# Patient Record
Sex: Male | Born: 2000 | Race: Black or African American | Hispanic: No | Marital: Single | Smoking: Never smoker
Health system: Southern US, Community
[De-identification: ages and names within clinical notes are randomized; demographics above are authoritative.]

## PROBLEM LIST (undated history)

## (undated) DIAGNOSIS — G35 Multiple sclerosis: Secondary | ICD-10-CM

## (undated) HISTORY — DX: Multiple sclerosis: G35

## (undated) HISTORY — PX: NO PAST SURGERIES: SHX2092

---

## 2019-07-31 ENCOUNTER — Emergency Department: Payer: Medicaid Other

## 2019-07-31 ENCOUNTER — Other Ambulatory Visit: Payer: Self-pay

## 2019-07-31 ENCOUNTER — Inpatient Hospital Stay
Admission: EM | Admit: 2019-07-31 | Discharge: 2019-08-03 | DRG: 060 | Disposition: A | Discharge status: Home / Self Care | Payer: Medicaid Other | Attending: Family Medicine | Admitting: Family Medicine

## 2019-07-31 DIAGNOSIS — R270 Ataxia, unspecified: Secondary | ICD-10-CM | POA: Diagnosis not present

## 2019-07-31 DIAGNOSIS — R260 Ataxic gait: Secondary | ICD-10-CM | POA: Diagnosis present

## 2019-07-31 DIAGNOSIS — Z820 Family history of epilepsy and other diseases of the nervous system: Secondary | ICD-10-CM

## 2019-07-31 DIAGNOSIS — R200 Anesthesia of skin: Secondary | ICD-10-CM

## 2019-07-31 DIAGNOSIS — R531 Weakness: Secondary | ICD-10-CM | POA: Diagnosis not present

## 2019-07-31 DIAGNOSIS — G35 Multiple sclerosis: Principal | ICD-10-CM | POA: Diagnosis present

## 2019-07-31 DIAGNOSIS — M545 Low back pain: Secondary | ICD-10-CM | POA: Diagnosis not present

## 2019-07-31 DIAGNOSIS — Z20822 Contact with and (suspected) exposure to covid-19: Secondary | ICD-10-CM | POA: Diagnosis present

## 2019-07-31 DIAGNOSIS — R29898 Other symptoms and signs involving the musculoskeletal system: Secondary | ICD-10-CM | POA: Diagnosis present

## 2019-07-31 DIAGNOSIS — Z03818 Encounter for observation for suspected exposure to other biological agents ruled out: Secondary | ICD-10-CM | POA: Diagnosis not present

## 2019-07-31 DIAGNOSIS — M6281 Muscle weakness (generalized): Secondary | ICD-10-CM | POA: Diagnosis not present

## 2019-07-31 LAB — CBC
HCT: 45.2 % (ref 39.0–52.0)
Hemoglobin: 14.7 g/dL (ref 13.0–17.0)
MCH: 27.8 pg (ref 26.0–34.0)
MCHC: 32.5 g/dL (ref 30.0–36.0)
MCV: 85.4 fL (ref 80.0–100.0)
Platelets: 258 10*3/uL (ref 150–400)
RBC: 5.29 MIL/uL (ref 4.22–5.81)
RDW: 12.3 % (ref 11.5–15.5)
WBC: 5.4 10*3/uL (ref 4.0–10.5)
nRBC: 0 % (ref 0.0–0.2)

## 2019-07-31 LAB — COMPREHENSIVE METABOLIC PANEL
ALT: 10 U/L (ref 0–44)
AST: 19 U/L (ref 15–41)
Albumin: 4.6 g/dL (ref 3.5–5.0)
Alkaline Phosphatase: 58 U/L (ref 38–126)
Anion gap: 10 (ref 5–15)
BUN: 11 mg/dL (ref 6–20)
CO2: 26 mmol/L (ref 22–32)
Calcium: 9.5 mg/dL (ref 8.9–10.3)
Chloride: 104 mmol/L (ref 98–111)
Creatinine, Ser: 0.87 mg/dL (ref 0.61–1.24)
GFR calc Af Amer: >60 mL/min (ref >60)
GFR calc non Af Amer: >60 mL/min (ref >60)
Glucose, Bld: 92 mg/dL (ref 70–99)
Potassium: 3.6 mmol/L (ref 3.5–5.1)
Sodium: 140 mmol/L (ref 135–145)
Total Bilirubin: 0.8 mg/dL (ref 0.3–1.2)
Total Protein: 7.9 g/dL (ref 6.5–8.1)

## 2019-07-31 LAB — GLUCOSE, CSF: Glucose, CSF: 54 mg/dL (ref 40–70)

## 2019-07-31 LAB — PROTEIN, CSF: Total  Protein, CSF: 42 mg/dL (ref 15–45)

## 2019-07-31 LAB — SEDIMENTATION RATE: Sed Rate: 5 mm/hr (ref 0–15)

## 2019-07-31 NOTE — ED Notes (Signed)
Complains of numbness of both legs starting at the waist. States this has been occurring for 4 days. States he has difficulty ambulating

## 2019-07-31 NOTE — ED Triage Notes (Signed)
Pt over by [facility]. Per [facility], the patients brain has not been talking to his legs for the past couple of days and he was having trouble walking.

## 2019-07-31 NOTE — ED Notes (Signed)
Pt states 5 days ago he was using the bathroom when he noticed his bottom was numb, pt got up stumbled and legs felt numb. Pt states he can move legs, denies pain, just feels increasingly numb.

## 2019-07-31 NOTE — ED Notes (Signed)
On phone with MRI.

## 2019-07-31 NOTE — ED Notes (Signed)
Dr. Brown at the bedside to discuss plan of care 

## 2019-07-31 NOTE — ED Triage Notes (Signed)
Pt sent from [facility] with c/o numbness of BL LE for the past 4 days states to the point he is having difficulty walking. Denies any injury. Denies any loss of bowel or bladder function

## 2019-07-31 NOTE — ED Provider Notes (Signed)
[facility] Emergency Department Provider Note  ____________________________________________   First MD Initiated Contact with Patient 07/31/19 1950     (approximate)  I have reviewed the triage vital signs and the nursing notes.   HISTORY  Chief Complaint Numbness    HPI Aaron Owens is a 19 y.o. male with no known chronic medical conditions presents to the emergency department secondary to 5-day history of perianal numbness, bilateral lower extremity weakness numbness and inability to ambulate since yesterday as a result.  Patient states that 5 days ago he noticed that when he would wipe his bottom after defecation that it was numb.  Patient states after defecating he when he attempted to walk he noticed bilateral lower extremity numbness which is progressively worsened since onset.  Patient states that starting yesterday he began having difficulty and inability to ambulate.  Patient denies any trauma.  Patient denies any back pain.  Patient denies any headache no visual changes.  No nausea or vomiting. Patient denies any dyspnea        History reviewed. No pertinent past medical history.  There are no problems to display for this patient.   History reviewed. No pertinent surgical history.  Prior to Admission medications   Not on File    Allergies Patient has no known allergies.  No family history on file.  Social History Social History   Tobacco Use  . Smoking status: Never Smoker  . Smokeless tobacco: Never Used  Substance Use Topics  . Alcohol use: Not Currently  . Drug use: Not Currently    Review of Systems Constitutional: No fever/chills Eyes: No visual changes. ENT: No sore throat. Cardiovascular: Denies chest pain. Respiratory: Denies shortness of breath. Gastrointestinal: No abdominal pain.  No nausea, no vomiting.  No diarrhea.  No constipation. Genitourinary: Negative for dysuria. Musculoskeletal: Negative for neck  pain.  Negative for back pain. Integumentary: Negative for rash. Neurological: Negative for headaches, focal weakness or numbness.   ____________________________________________   PHYSICAL EXAM:  VITAL SIGNS: ED Triage Vitals  Enc Vitals Group     BP 07/31/19 1654 123/76     Pulse Rate 07/31/19 1654 89     Resp 07/31/19 1654 17     Temp 07/31/19 1654 98.4 F (36.9 C)     Temp Source 07/31/19 1654 Oral     SpO2 07/31/19 1654 98 %     Weight 07/31/19 1655 63.5 kg (140 lb)     Height 07/31/19 1655 1.702 m (5\' 7" )     Head Circumference --      Peak Flow --      Pain Score 07/31/19 1655 0     Pain Loc --      Pain Edu? --      Excl. in GC? --     Constitutional: Alert and oriented. Eyes: Conjunctivae are normal.  Mouth/Throat: Patient is wearing a mask. Neck: No stridor.  No meningeal signs.   Cardiovascular: Normal rate, regular rhythm. Good peripheral circulation. Grossly normal heart sounds. Respiratory: Normal respiratory effort.  No retractions. Gastrointestinal: Soft and nontender. No distention.  Musculoskeletal: No lower extremity tenderness nor edema. No gross deformities of extremities. Neurologic:  Normal speech and language.  No pronator drift, no lower extremity weakness with elevation of leg off the bed for 5 seconds.  However with ambulation patient unable to bear his own weight, with assistance the patient is markedly ataxic.  Patellar reflex 2+, ankle reflex 1+ Skin:  Skin is warm, dry  and intact. Psychiatric: Mood and affect are normal. Speech and behavior are normal.  ____________________________________________   LABS (all labs ordered are listed, but only abnormal results are displayed)  Labs Reviewed  CSF CELL COUNT WITH DIFFERENTIAL - Abnormal; Notable for the following components:      Result Value   Appearance, CSF CLEAR (*)    RBC Count, CSF 105 (*)    WBC, CSF 9 (*)    Lymphs, CSF 98 (*)    Monocyte-Macrophage-Spinal Fluid 2 (*)    All  other components within normal limits  RESPIRATORY PANEL BY RT PCR (FLU A&B, COVID)  CSF CULTURE  CBC  COMPREHENSIVE METABOLIC PANEL  SEDIMENTATION RATE  GLUCOSE, CSF  PROTEIN, CSF  B. BURGDORFI ANTIBODIES  LYME DISEASE, WESTERN BLOT  C-REACTIVE PROTEIN   _________________________________  RADIOLOGY I, Sheridan N Jasmen Emrich, personally viewed and evaluated these images (plain radiographs) as part of my medical decision making, as well as reviewing the written report by the radiologist.  ED MD interpretation: CT head revealed no acute intracranial abnormality.  Normal MRI of the lumbar spine.  Official radiology report(s): CT Head Wo Contrast  Result Date: 07/31/2019 CLINICAL DATA:  Ataxia, stroke suspected. EXAM: CT HEAD WITHOUT CONTRAST TECHNIQUE: Contiguous axial images were obtained from the base of the skull through the vertex without intravenous contrast. COMPARISON:  None. FINDINGS: Brain: No evidence of acute infarction, hemorrhage, hydrocephalus, extra-axial collection or mass lesion/mass effect. Vascular: No hyperdense vessel or unexpected calcification. Skull: No calvarial fracture or suspicious osseous lesion. No scalp swelling or hematoma. Sinuses/Orbits: Paranasal sinuses and mastoid air cells are predominantly clear. Included orbital structures are unremarkable. Other: None IMPRESSION: No acute intracranial abnormality. If persisting concern for infarct or ischemia, MRI is more sensitive and specific for early features. Electronically Signed   By: Kreg Shropshire M.D.   On: 07/31/2019 20:35   MR LUMBAR SPINE WO CONTRAST  Result Date: 07/31/2019 CLINICAL DATA:  Initial evaluation for acute low back pain, progressive neurologic deficit with saddle anesthesia, bilateral leg numbness and weakness. EXAM: MRI LUMBAR SPINE WITHOUT CONTRAST TECHNIQUE: Multiplanar, multisequence MR imaging of the lumbar spine was performed. No intravenous contrast was administered. COMPARISON:  None.  FINDINGS: Segmentation: Standard. Lowest well-formed disc space labeled the L5-S1 level. Alignment: Physiologic with preservation of the normal lumbar lordosis. No listhesis. Vertebrae: Vertebral body height maintained without evidence for acute or chronic fracture. Bone marrow signal intensity within normal limits. No discrete or worrisome osseous lesions. No abnormal marrow edema. Conus medullaris and cauda equina: Conus extends to the T12 level. Conus and cauda equina appear normal. Paraspinal and other soft tissues: Paraspinous soft tissues within normal limits. 11 mm simple cyst noted within the interpolar left kidney. Visualized visceral structures otherwise unremarkable. Disc levels: No significant disc pathology seen within the lumbar spine. Intervertebral discs are well hydrated with preserved disc height. No disc bulge or focal disc protrusion. No significant facet degeneration. No canal or neural foraminal stenosis. No neural impingement. IMPRESSION: Normal MRI of the lumbar spine. No findings to explain patient's symptoms identified. Electronically Signed   By: Rise Mu M.D.   On: 07/31/2019 21:05     .Lumbar Puncture  Date/Time: 08/01/2019 12:23 AM Performed by: Darci Current, MD Authorized by: Darci Current, MD   Consent:    Consent obtained:  Verbal and written   Consent given by:  Patient   Risks discussed:  Headache, bleeding, infection, pain and nerve damage Pre-procedure details:    Procedure purpose:  Diagnostic   Preparation: Patient was prepped and draped in usual sterile fashion   Anesthesia (see MAR for exact dosages):    Anesthesia method:  Local infiltration   Local anesthetic:  Lidocaine 1% w/o epi Procedure details:    Lumbar space:  L3-L4 interspace   Patient position:  L lateral decubitus   Needle gauge:  22   Needle type:  Spinal needle - Quincke tip   Needle length (in):  2.5   Number of attempts:  2   Fluid appearance:  Blood-tinged and  clear   Tubes of fluid:  4 Post-procedure:    Puncture site:  Adhesive bandage applied and direct pressure applied   Patient tolerance of procedure:  Tolerated well, no immediate complications  .Critical Care Performed by: Darci Current, MD Authorized by: Darci Current, MD   Critical care provider statement:    Critical care time (minutes):  60   Critical care time was exclusive of:  Separately billable procedures and treating other patients   Critical care was necessary to treat or prevent imminent or life-threatening deterioration of the following conditions:  CNS failure or compromise   Critical care was time spent personally by me on the following activities:  Development of treatment plan with patient or surrogate, discussions with consultants, evaluation of patient's response to treatment, examination of patient, obtaining history from patient or surrogate, ordering and performing treatments and interventions, ordering and review of laboratory studies, ordering and review of radiographic studies, pulse oximetry, re-evaluation of patient's condition and review of old charts     ____________________________________________   INITIAL IMPRESSION / MDM / ASSESSMENT AND PLAN / ED COURSE  As part of my medical decision making, I reviewed the following data within the electronic MEDICAL RECORD NUMBER  19 year old male presenting with above-stated history and physical exam with differential diagnosis including but not limited to Almond Lint, lumbar spine lesion with spinal cord compression, syrinx, transverse myelitis, multiple sclerosis.  As such MRI of the lumbar spine was performed which revealed no gross abnormality CT scan of the head also performed which was unremarkable.  Lumbar puncture performed and pending.   23:50 Patient discussed with Dr Algis Greenhouse Neurology on call at [facility] who agreed with concern for Margarita Mail however unfortunately [facility] has no available beds  12:00 AM  patient discussed with [facility] transfer center who advised that they had no capacity.   12:05 AM patient discussed at [facility], awaiting call back from hospitalist   12:38 AM patient discussed with Dr. Onalee Hua hospitalist on-call at [facility].  I was informed that there was no capacity at [facility] at this time and as such the patient will be waitlisted  12:42 AM  Patient discussed with Dr. Jerrell Belfast neurologist on-call at [facility] who states that "this is not Aggie Hacker".  He advised that NO IVIG should be administered.  Dr. Lisabeth Devoid recommended that the patient be admitted to the hospital here with neurology consultation tomorrow morning.  Patient subsequently discussed with Dr. Maisie Fus hospitalist here at [facility] for admission for further evaluation and management.  Clinical Course as of Jul 31 21  Tue Jul 31, 2019  2143 Lyme disease dna by pcr(borrelia burg) [RB]    Clinical Course User Index [RB] Darci Current, MD     ____________________________________________  FINAL CLINICAL IMPRESSION(S) / ED DIAGNOSES  Final diagnoses:  Ataxia  Saddle anesthesia     MEDICATIONS GIVEN DURING THIS VISIT:  Medications - No  data to display   ED Discharge Orders    None      *Please note:  Stanlee Coll was evaluated in Emergency Department on 08/01/2019 for the symptoms described in the history of present illness. He was evaluated in the context of the global COVID-19 pandemic, which necessitated consideration that the patient might be at risk for infection with the SARS-CoV-2 virus that causes COVID-19. Institutional protocols and algorithms that pertain to the evaluation of patients at risk for COVID-19 are in a state of rapid change based on information released by regulatory bodies including the CDC and federal and state organizations. These policies and algorithms were followed during the patient's care in the ED.  Some ED evaluations and  interventions may be delayed as a result of limited staffing during the pandemic.*  Note:  This document was prepared using Dragon voice recognition software and may include unintentional dictation errors.   Gregor Hams, MD 08/01/19 0100

## 2019-07-31 NOTE — ED Notes (Signed)
Pt to CT

## 2019-08-01 ENCOUNTER — Inpatient Hospital Stay
Admission: EM | Admit: 2019-08-01 | Payer: Medicaid Other | Source: Other Acute Inpatient Hospital | Admitting: Family Medicine

## 2019-08-01 DIAGNOSIS — Z820 Family history of epilepsy and other diseases of the nervous system: Secondary | ICD-10-CM | POA: Diagnosis not present

## 2019-08-01 DIAGNOSIS — R29898 Other symptoms and signs involving the musculoskeletal system: Secondary | ICD-10-CM | POA: Diagnosis present

## 2019-08-01 DIAGNOSIS — M545 Low back pain: Secondary | ICD-10-CM | POA: Diagnosis not present

## 2019-08-01 DIAGNOSIS — R260 Ataxic gait: Secondary | ICD-10-CM | POA: Diagnosis not present

## 2019-08-01 DIAGNOSIS — G35 Multiple sclerosis: Principal | ICD-10-CM

## 2019-08-01 DIAGNOSIS — M6281 Muscle weakness (generalized): Secondary | ICD-10-CM | POA: Diagnosis not present

## 2019-08-01 DIAGNOSIS — R270 Ataxia, unspecified: Secondary | ICD-10-CM | POA: Diagnosis not present

## 2019-08-01 DIAGNOSIS — R531 Weakness: Secondary | ICD-10-CM | POA: Diagnosis not present

## 2019-08-01 DIAGNOSIS — Z03818 Encounter for observation for suspected exposure to other biological agents ruled out: Secondary | ICD-10-CM | POA: Diagnosis not present

## 2019-08-01 DIAGNOSIS — Z20822 Contact with and (suspected) exposure to covid-19: Secondary | ICD-10-CM | POA: Diagnosis not present

## 2019-08-01 LAB — CSF CELL COUNT WITH DIFFERENTIAL
Eosinophils, CSF: 0 % (ref 0–1)
Lymphs, CSF: 98 % — ABNORMAL HIGH (ref 40–80)
Monocyte-Macrophage-Spinal Fluid: 2 % — ABNORMAL LOW (ref 15–45)
RBC Count, CSF: 105 /mm3 — ABNORMAL HIGH
Segmented Neutrophils-CSF: 0 % (ref 0–6)
Tube #: 1
WBC, CSF: 9 /mm3 — ABNORMAL HIGH (ref 0–5)

## 2019-08-01 LAB — URINALYSIS, ROUTINE W REFLEX MICROSCOPIC
Bacteria, UA: NONE SEEN
Bilirubin Urine: NEGATIVE
Glucose, UA: NEGATIVE mg/dL
Hgb urine dipstick: NEGATIVE
Ketones, ur: 20 mg/dL — AB
Leukocytes,Ua: NEGATIVE
Nitrite: NEGATIVE
Protein, ur: 30 mg/dL — AB
Specific Gravity, Urine: 1.036 — ABNORMAL HIGH (ref 1.005–1.030)
Squamous Epithelial / HPF: NONE SEEN (ref 0–5)
pH: 5 (ref 5.0–8.0)

## 2019-08-01 LAB — COMPREHENSIVE METABOLIC PANEL
ALT: 10 U/L (ref 0–44)
AST: 19 U/L (ref 15–41)
Albumin: 4.5 g/dL (ref 3.5–5.0)
Alkaline Phosphatase: 57 U/L (ref 38–126)
Anion gap: 9 (ref 5–15)
BUN: 13 mg/dL (ref 6–20)
CO2: 29 mmol/L (ref 22–32)
Calcium: 9.7 mg/dL (ref 8.9–10.3)
Chloride: 104 mmol/L (ref 98–111)
Creatinine, Ser: 0.89 mg/dL (ref 0.61–1.24)
GFR calc Af Amer: >60 mL/min (ref >60)
GFR calc non Af Amer: >60 mL/min (ref >60)
Glucose, Bld: 92 mg/dL (ref 70–99)
Potassium: 3.4 mmol/L — ABNORMAL LOW (ref 3.5–5.1)
Sodium: 142 mmol/L (ref 135–145)
Total Bilirubin: 1 mg/dL (ref 0.3–1.2)
Total Protein: 7.9 g/dL (ref 6.5–8.1)

## 2019-08-01 LAB — VITAMIN B12: Vitamin B-12: 286 pg/mL (ref 180–914)

## 2019-08-01 LAB — ETHANOL: Alcohol, Ethyl (B): <10 mg/dL (ref <10)

## 2019-08-01 LAB — URINE DRUG SCREEN, QUALITATIVE (ARMC ONLY)
Amphetamines, Ur Screen: NOT DETECTED
Barbiturates, Ur Screen: NOT DETECTED
Benzodiazepine, Ur Scrn: NOT DETECTED
Cannabinoid 50 Ng, Ur ~~LOC~~: POSITIVE — AB
Cocaine Metabolite,Ur ~~LOC~~: NOT DETECTED
MDMA (Ecstasy)Ur Screen: NOT DETECTED
Methadone Scn, Ur: NOT DETECTED
Opiate, Ur Screen: NOT DETECTED
Phencyclidine (PCP) Ur S: NOT DETECTED
Tricyclic, Ur Screen: NOT DETECTED

## 2019-08-01 LAB — CBC
HCT: 42.8 % (ref 39.0–52.0)
Hemoglobin: 13.8 g/dL (ref 13.0–17.0)
MCH: 27.2 pg (ref 26.0–34.0)
MCHC: 32.2 g/dL (ref 30.0–36.0)
MCV: 84.4 fL (ref 80.0–100.0)
Platelets: 251 10*3/uL (ref 150–400)
RBC: 5.07 MIL/uL (ref 4.22–5.81)
RDW: 12.4 % (ref 11.5–15.5)
WBC: 6.1 10*3/uL (ref 4.0–10.5)
nRBC: 0 % (ref 0.0–0.2)

## 2019-08-01 LAB — C-REACTIVE PROTEIN: CRP: <0.5 mg/dL (ref <1.0)

## 2019-08-01 LAB — HEMOGLOBIN A1C
Hgb A1c MFr Bld: 5.7 % — ABNORMAL HIGH (ref 4.8–5.6)
Mean Plasma Glucose: 116.89 mg/dL

## 2019-08-01 LAB — RAPID HIV SCREEN (HIV 1/2 AB+AG)
HIV 1/2 Antibodies: NONREACTIVE
HIV-1 P24 Antigen - HIV24: NONREACTIVE

## 2019-08-01 LAB — RESPIRATORY PANEL BY RT PCR (FLU A&B, COVID)
Influenza A by PCR: NEGATIVE
Influenza B by PCR: NEGATIVE
SARS Coronavirus 2 by RT PCR: NEGATIVE

## 2019-08-01 LAB — CK: Total CK: 131 U/L (ref 49–397)

## 2019-08-01 LAB — FOLATE: Folate: 7.8 ng/mL (ref >5.9)

## 2019-08-01 LAB — TSH: TSH: 1.992 u[IU]/mL (ref 0.350–4.500)

## 2019-08-01 MED ORDER — THIAMINE HCL 100 MG/ML IJ SOLN: Freq: Once | INTRAVENOUS | Status: DC

## 2019-08-01 MED ORDER — ONDANSETRON HCL 4 MG/2ML IJ SOLN: 4.0000 mg | Freq: Four times a day (QID) | INTRAMUSCULAR | Status: DC | PRN

## 2019-08-01 MED ORDER — ACETAMINOPHEN 650 MG RE SUPP: 650.0000 mg | Freq: Four times a day (QID) | RECTAL | Status: DC | PRN

## 2019-08-01 MED ORDER — SODIUM CHLORIDE 0.9 % IV SOLN
1000.0000 mg | INTRAVENOUS | Status: AC
  Administered 2019-08-01 – 2019-08-03 (×3): 1000 mg via INTRAVENOUS
  Filled 2019-08-01 (×3): qty 8

## 2019-08-01 MED ORDER — ACETAMINOPHEN 325 MG PO TABS: 650.0000 mg | ORAL_TABLET | Freq: Four times a day (QID) | ORAL | Status: DC | PRN

## 2019-08-01 MED ORDER — GADOBUTROL 1 MMOL/ML IV SOLN
6.0000 mL | Freq: Once | INTRAVENOUS | Status: AC | PRN
  Administered 2019-08-01: 6 mL via INTRAVENOUS

## 2019-08-01 MED ORDER — PANTOPRAZOLE SODIUM 40 MG PO TBEC
40.0000 mg | DELAYED_RELEASE_TABLET | Freq: Every day | ORAL | Status: DC
  Administered 2019-08-01 – 2019-08-03 (×2): 40 mg via ORAL
  Filled 2019-08-01 (×3): qty 1

## 2019-08-01 MED ORDER — SODIUM CHLORIDE 0.9% FLUSH
10.0000 mL | Freq: Two times a day (BID) | INTRAVENOUS | Status: DC
  Administered 2019-08-01 – 2019-08-03 (×4): 10 mL via INTRAVENOUS

## 2019-08-01 MED ORDER — ONDANSETRON HCL 4 MG PO TABS: 4.0000 mg | ORAL_TABLET | Freq: Four times a day (QID) | ORAL | Status: DC | PRN

## 2019-08-01 MED ORDER — HEPARIN SODIUM (PORCINE) 5000 UNIT/ML IJ SOLN
5000.0000 [IU] | Freq: Three times a day (TID) | INTRAMUSCULAR | Status: DC
  Administered 2019-08-01 – 2019-08-02 (×5): 5000 [IU] via SUBCUTANEOUS
  Filled 2019-08-01 (×5): qty 1

## 2019-08-01 NOTE — ED Notes (Signed)
Pt remains in MRI at this time  

## 2019-08-01 NOTE — H&P (Addendum)
History and Physical    Aaron Owens YKD:[id number] DOB: 08/15/2000 DOA: 07/31/2019  PCP: Patient, No Pcp Per  Patient coming from: Home  I have personally briefly reviewed patient's old medical records in [facility]  Chief Complaint: Bilateral numbness of feet with inability to walk HPI: Aaron Owens is a 19 y.o. male with no past medical history, he does have family history of grandmother with multiple sclerosis who now presents to the ED with 5-day history of bilateral feet numbness with progressive ascension numbness to around umbilicus.  Patient states that around 5 days ago he tripped and injured his his right ankle he thought nothing of it at that time although he did note it at that time was somewhat numb.  As by the second day however he noted that both his feet were numb, either third day noted that numbness was extending up both his lower extremities.  He states on the fifth day he got concerned numbness continue to extend up his torso to his umbilicus.  He describes a feeling of numbness of as being in a block of ice he states he has no weakness is unable to wall walk because he is unable to feel the ground below him.  He denies any recent infections any recent illnesses, fever chills or otherwise been unwell.  Denies any sick contacts.  He notes he has never had symptoms like this in the past.   ED Course:  In the ED patient was evaluated he was noted to have negative respiratory panel, was also noted to have negative CT of the head his labs were also noted to be unremarkable.  There was concern that patient may have GBS and a lumbar spinal tap was completed which was noted normal protein 9 white cells and 105 RBCs and normal glucose.  Neurology was consulted who recommended admission and based on the clinical case did not feel strongly that patient had Guillain-Barr syndrome.  And at that time neurology on-call Dr. Wilford Corner neuro did not recommend IVIG infusion.  However further  imaging was recommended to evaluate for demyelinating lesions versus transverse myelitis.  Of note MRI results noted Extensive patchy T2/FLAIR signal abnormality involving the supratentorial and infratentorial cerebral white matter, consistent with demyelinating disease/multiple sclerosis Review of Systems: As per HPI otherwise 10 point review of systems negative.   History reviewed. No pertinent past medical history. As noted above History reviewed. No pertinent surgical history. No surgical history  reports that he has never smoked. He has never used smokeless tobacco. He reports previous alcohol use. He reports previous drug use.  No Known Allergies  No family history on file. Irving Burton history of multiple sclerosis grandmother Prior to Admission medications   Not on File    Physical Exam: Vitals:   07/31/19 1654 07/31/19 1655 08/01/19 0241  BP: 123/76  110/69  Pulse: 89  67  Resp: 17  (!) 25  Temp: 98.4 F (36.9 C)    TempSrc: Oral    SpO2: 98%  100%  Weight:  63.5 kg   Height:  5\' 7"  (1.702 m)     Constitutional: NAD, calm, comfortable Vitals:   07/31/19 1654 07/31/19 1655 08/01/19 0241  BP: 123/76  110/69  Pulse: 89  67  Resp: 17  (!) 25  Temp: 98.4 F (36.9 C)    TempSrc: Oral    SpO2: 98%  100%  Weight:  63.5 kg   Height:  5\' 7"  (1.702 m)    Eyes: PERRL, lids  and conjunctivae normal ENMT: Mucous membranes are moist. Posterior pharynx clear of any exudate or lesions.Normal dentition.  Neck: normal, supple, no masses, no thyromegaly Respiratory: clear to auscultation bilaterally, no wheezing, no crackles. Normal respiratory effort. No accessory muscle use.  Cardiovascular: Regular rate and rhythm, no murmurs / rubs / gallops. No extremity edema. 2+ pedal pulses. No carotid bruits.  Abdomen: no tenderness, no masses palpated. No hepatosplenomegaly. Bowel sounds positive.  Musculoskeletal: no clubbing / cyanosis. No joint deformity upper and lower extremities. Good  ROM, no contractures. Normal muscle tone.  Skin: no rashes, lesions, ulcers. No induration Neurologic: CN 2-12 grossly intact. Sensation intact, DTR normal. Strength 5/5 in all 4.  Patient is am to walk  Has unsteady gain. He is unable to feel his position in space . Psychiatric: Normal judgment and insight. Alert and oriented x 3. Normal mood.    Labs on Admission: I have personally reviewed following labs and imaging studies  CBC: Recent Labs  Lab 07/31/19 2007  WBC 5.4  HGB 14.7  HCT 45.2  MCV 85.4  PLT 258   Basic Metabolic Panel: Recent Labs  Lab 07/31/19 2007  NA 140  K 3.6  CL 104  CO2 26  GLUCOSE 92  BUN 11  CREATININE 0.87  CALCIUM 9.5   GFR: Estimated Creatinine Clearance: 121.6 mL/min (by C-G formula based on SCr of 0.87 mg/dL). Liver Function Tests: Recent Labs  Lab 07/31/19 2007  AST 19  ALT 10  ALKPHOS 58  BILITOT 0.8  PROT 7.9  ALBUMIN 4.6   No results for input(s): LIPASE, AMYLASE in the last 168 hours. No results for input(s): AMMONIA in the last 168 hours. Coagulation Profile: No results for input(s): INR, PROTIME in the last 168 hours. Cardiac Enzymes: No results for input(s): CKTOTAL, CKMB, CKMBINDEX, TROPONINI in the last 168 hours. BNP (last 3 results) No results for input(s): PROBNP in the last 8760 hours. HbA1C: No results for input(s): HGBA1C in the last 72 hours. CBG: No results for input(s): GLUCAP in the last 168 hours. Lipid Profile: No results for input(s): CHOL, HDL, LDLCALC, TRIG, CHOLHDL, LDLDIRECT in the last 72 hours. Thyroid Function Tests: No results for input(s): TSH, T4TOTAL, FREET4, T3FREE, THYROIDAB in the last 72 hours. Anemia Panel: No results for input(s): VITAMINB12, FOLATE, FERRITIN, TIBC, IRON, RETICCTPCT in the last 72 hours. Urine analysis: No results found for: COLORURINE, APPEARANCEUR, LABSPEC, PHURINE, GLUCOSEU, HGBUR, BILIRUBINUR, KETONESUR, PROTEINUR, UROBILINOGEN, NITRITE,  LEUKOCYTESUR  Radiological Exams on Admission: CT Head Wo Contrast  Result Date: 07/31/2019 CLINICAL DATA:  Ataxia, stroke suspected. EXAM: CT HEAD WITHOUT CONTRAST TECHNIQUE: Contiguous axial images were obtained from the base of the skull through the vertex without intravenous contrast. COMPARISON:  None. FINDINGS: Brain: No evidence of acute infarction, hemorrhage, hydrocephalus, extra-axial collection or mass lesion/mass effect. Vascular: No hyperdense vessel or unexpected calcification. Skull: No calvarial fracture or suspicious osseous lesion. No scalp swelling or hematoma. Sinuses/Orbits: Paranasal sinuses and mastoid air cells are predominantly clear. Included orbital structures are unremarkable. Other: None IMPRESSION: No acute intracranial abnormality. If persisting concern for infarct or ischemia, MRI is more sensitive and specific for early features. Electronically Signed   By: Kreg Shropshire M.D.   On: 07/31/2019 20:35   MR LUMBAR SPINE WO CONTRAST  Result Date: 07/31/2019 CLINICAL DATA:  Initial evaluation for acute low back pain, progressive neurologic deficit with saddle anesthesia, bilateral leg numbness and weakness. EXAM: MRI LUMBAR SPINE WITHOUT CONTRAST TECHNIQUE: Multiplanar, multisequence MR imaging of the  lumbar spine was performed. No intravenous contrast was administered. COMPARISON:  None. FINDINGS: Segmentation: Standard. Lowest well-formed disc space labeled the L5-S1 level. Alignment: Physiologic with preservation of the normal lumbar lordosis. No listhesis. Vertebrae: Vertebral body height maintained without evidence for acute or chronic fracture. Bone marrow signal intensity within normal limits. No discrete or worrisome osseous lesions. No abnormal marrow edema. Conus medullaris and cauda equina: Conus extends to the T12 level. Conus and cauda equina appear normal. Paraspinal and other soft tissues: Paraspinous soft tissues within normal limits. 11 mm simple cyst noted within  the interpolar left kidney. Visualized visceral structures otherwise unremarkable. Disc levels: No significant disc pathology seen within the lumbar spine. Intervertebral discs are well hydrated with preserved disc height. No disc bulge or focal disc protrusion. No significant facet degeneration. No canal or neural foraminal stenosis. No neural impingement. IMPRESSION: Normal MRI of the lumbar spine. No findings to explain patient's symptoms identified. Electronically Signed   By: Jeannine Boga M.D.   On: 07/31/2019 21:05    EKG: Independently reviewed. N/A  Assessment/Plan Multiple sclerosis new diagnosis   Plan New diagnosis of multiple sclerosis -We will start on pulse dose steroids 1g solumedrol x 3 days  -Neurology consult in a.m. for further Rex  GI prophylaxis -PPI  DVT prophylaxis: Lovenox  code Status: Full  family Communication: Not applicable Disposition Plan: 5 days  consults called: Neurology Dr. Rory Percy Admission status: Inpatient  Clance Boll MD [facility] Pager [phone]  If 7PM-7AM, please contact night-coverage [email or web address] Password [facility]  08/01/2019, 2:46 AM

## 2019-08-01 NOTE — ED Notes (Signed)
Went in to check on pt and pt is resting at this time.

## 2019-08-01 NOTE — ED Notes (Signed)
Pt resting in bed, nad at this time

## 2019-08-01 NOTE — ED Notes (Signed)
Pt given meal tray and apple juice 

## 2019-08-01 NOTE — Consult Note (Signed)
Neurology Consultation Reason for Consult: Multiple sclerosis Referring Physician: Skip Mayer  CC: Difficulty walking  History is obtained from: Patient  HPI: Aaron Owens is a 19 y.o. male who presents with numbness and difficulty walking that is been going on for the past few days.  He states that he has to look down and watch where his feet are in order to be able to walk.  He reports a similar episode in 2018 where he had left leg numbness that gradually improved over the course of several weeks.  He also reports an episode of vision change in early 2020 where he had double vision but also improved over several weeks.    ROS: A 14 point ROS was performed and is negative except as noted in the HPI.   History reviewed. No pertinent past medical history.   Family history: Great-grandmother with MS  Social History:  reports that he has never smoked. He has never used smokeless tobacco. He reports previous alcohol use. He reports previous drug use.   Exam: Current vital signs: BP 103/64   Pulse 69   Temp 98.4 F (36.9 C) (Oral)   Resp 17   Ht 5\' 7"  (1.702 m)   Wt 63.5 kg   SpO2 98%   BMI 21.93 kg/m  Vital signs in last 24 hours: Temp:  [98.4 F (36.9 C)] 98.4 F (36.9 C) (01/12 1654) Pulse Rate:  [53-89] 69 (01/13 0600) Resp:  [16-25] 17 (01/13 0630) BP: (99-123)/(57-78) 103/64 (01/13 0630) SpO2:  [97 %-100 %] 98 % (01/13 0600) Weight:  [63.5 kg] 63.5 kg (01/12 1655)   Physical Exam  Constitutional: Appears well-developed and well-nourished.  Psych: Affect appropriate to situation Eyes: No scleral injection HENT: No OP obstrucion MSK: no joint deformities.  Cardiovascular: Normal rate and regular rhythm.  Respiratory: Effort normal, non-labored breathing GI: Soft.  No distension. There is no tenderness.  Skin: WDI  Neuro: Mental Status: Patient is awake, alert, oriented to person, place, month, year, and situation. Patient is able to give a clear  and coherent history. No signs of aphasia or neglect Cranial Nerves: II: Visual Fields are full. Pupils are equal, round, and reactive to light.   III,IV, VI: EOMI without ptosis or diploplia.  V: Facial sensation is symmetric to temperature VII: Facial movement is symmetric.  VIII: hearing is intact to voice X: Uvula elevates symmetrically XI: Shoulder shrug is symmetric. XII: tongue is midline without atrophy or fasciculations.  Motor: Tone is normal. Bulk is normal. 5/5 strength was present in all four extremities with the exception of hip flexion on the right which is 4+/5 Sensory: Sensation is diminished distally in both lower extremities Deep Tendon Reflexes: 2+ and symmetric in the biceps and patellae.  Plantars: Toes are downgoing bilaterally.  Cerebellar: FNF intact bilaterally   I have reviewed labs in epic and the results pertinent to this consultation are: CMP-unremarkable  I have reviewed the images obtained: MRI brain-spine-multiple lesions consistent with multiple sclerosis  Impression: 19 year old male with at least 2 previous clinical episodes and enhancing/nonenhancing lesions in multiple locations including the spinal cord in a pattern very much consistent with multiple sclerosis.  The mild pleocytosis on LP is consistent with that as well.  He has been started on high-dose IV Solu-Medrol  Recommendations: 1) continue Solu-Medrol for 3 to 5 days 2) add OC bands and IgG index to CSF 3) he will need follow-up with neurology for disease modifying therapy.    24, MD [facility] [phone]  If 7pm- 7am, please page neurology on call as listed in [place].

## 2019-08-01 NOTE — Plan of Care (Addendum)
Called by Dr. Manson Passey regarding concern for Guillain-Barr syndrome for this patient. Based on history-5 days worth of ascending tingling, numbness and weakness. Based on Dr. Theora Gianotti examination, he has lower extremity weakness and ataxia but has preserved knee jerks and mildly reduced ankle jerks bilaterally. CSF studies show normal protein and nine white cells with 105 RBCs and normal glucose. I do not think that this is consistent with AIDP. I would recommend further imaging-MRI of the brain C-spine and T-spine with and without contrast to look for demyelinating lesions versus transverse myelitis as the etiology of this presentation.  Lumbar spine MRI was obtained for emergent concern of cauda equina syndrome and is normal at this time.  I have recommended that the patient be admitted if he is not able to [facility], imaging be done and a inpatient neurological consultation be obtained over [facility] in the morning.  Please call [facility] as needed.  -- Milon Dikes, MD [facility] Pager: [phone] If 7pm to 7am, please call on call as listed on AMION.  ADDENDUM -MRI brain and C-spine with demyelinating lesions, with active enhancement. Likely MS. -Recommend IV solumedrol - 1g daily for 3 days. Please call on call neurologist as listed on AMION in the AM. Further recs after consult in the AM. Please call [facility] as needed.  -- Milon Dikes, MD [facility] Pager: [phone] If 7pm to 7am, please call on call as listed on AMION.

## 2019-08-02 LAB — IGG CSF INDEX
Albumin CSF-mCnc: 32 mg/dL (ref 11–48)
Albumin: 4.7 g/dL (ref 4.1–5.2)
CSF IgG Index: 0.8 — ABNORMAL HIGH (ref 0.0–0.7)
IgG (Immunoglobin G), Serum: 1439 mg/dL (ref 603–1613)
IgG, CSF: 7.6 mg/dL (ref 0.0–8.6)
IgG/Alb Ratio, CSF: 0.24 (ref 0.00–0.25)

## 2019-08-02 LAB — B. BURGDORFI ANTIBODIES: B burgdorferi Ab IgG+IgM: <0.91 {ISR} (ref 0.00–0.90)

## 2019-08-02 MED ORDER — ENOXAPARIN SODIUM 40 MG/0.4ML ~~LOC~~ SOLN
40.0000 mg | SUBCUTANEOUS | Status: DC
  Administered 2019-08-03: 40 mg via SUBCUTANEOUS
  Filled 2019-08-02: qty 0.4

## 2019-08-02 MED ORDER — SODIUM CHLORIDE 0.9 % IV SOLN
INTRAVENOUS | Status: DC | PRN
  Administered 2019-08-02: 250 mL via INTRAVENOUS

## 2019-08-02 NOTE — Progress Notes (Signed)
Reason for consult: MS exacerbation  Subjective: patient feels better, has been working with PT/OT   ROS: negative except above  Examination  Vital signs in last 24 hours: Temp:  [97.6 F (36.4 C)-98.4 F (36.9 C)] 97.8 F (36.6 C) (01/14 0801) Pulse Rate:  [56-95] 56 (01/14 0801) Resp:  [13-20] 18 (01/14 0801) BP: (105-131)/(65-80) 105/71 (01/14 0801) SpO2:  [99 %-100 %] 100 % (01/14 0801) Weight:  [59.4 kg-61.9 kg] 61.9 kg (01/14 0452)  General: lying in bed CVS: pulse-normal rate and rhythm RS: breathing comfortably Extremities: normal   Neuro: MS: Alert, oriented, follows commands CN: pupils equal and reactive,  EOMI, face symmetric, tongue midline, normal sensation over face, Motor: right leg 4+/5, remaining extremities 5/5 Reflexes: 2+ bilaterally over patella, biceps, plantars: flexor Coordination: normal Gait: not tested  Basic Metabolic Panel: Recent Labs  Lab 07/31/19 2007 08/01/19 0345  NA 140 142  K 3.6 3.4*  CL 104 104  CO2 26 29  GLUCOSE 92 92  BUN 11 13  CREATININE 0.87 0.89  CALCIUM 9.5 9.7    CBC: Recent Labs  Lab 07/31/19 2007 08/01/19 0345  WBC 5.4 6.1  HGB 14.7 13.8  HCT 45.2 42.8  MCV 85.4 84.4  PLT 258 251     Coagulation Studies: No results for input(s): LABPROT, INR in the last 72 hours.  Imaging Reviewed:  MRI Brain, C and T spine   ASSESSMENT AND PLAN  32 y male with numbness and difficulty walking with new diagnosis with MS.Has 2 previous clinical episodes of numbness,  MRI Brain, C and T spine consistent with MS with some enchancing lesions suggestive acute demyelination.  Plan 1) continue Solu-Medrol for 3 to 5 days 2)  IgG index /Oligoclonal bands pending 3) Will need follow up : recommend Dr. Epimenio Foot with [facility] as soon as possible to start DMD 4) Continue PT/OT   Spent 25 min counseling on diagnosis of MS and importance of starting DMD to prevent MS progression.       Georgiana Spinner Mercer Stallworth [facility] Pager Number [phone] For questions after 7pm please refer to AMION to reach the Neurologist on call

## 2019-08-02 NOTE — Progress Notes (Signed)
PROGRESS NOTE    Maico Cardell  BJY:[id number] DOB: 2001/07/16 DOA: 07/31/2019 PCP: Patient, No Pcp Per      Brief Narrative:  Mr. Forcier is a 19 y.o. M with no significant past medical history who presented with several days bilateral feet numbness, saddle anesthesia, and difficulty walking.  Of note, the patient had a similar episode in 2018, where he had leg numbness "like if you been sitting on the toilet too long" the gradually improved by itself over several weeks, as well as an episode of diplopia in early 2020 that also improved by itself after several weeks.  In the ER here, the patient had an LP that showed mild pleocytosis, and an MRI that showed extensive patchy T2/flair signal abnormality in the supratentorial and infratentorial white matter.  Case was discussed with neurology recommended admission for high-dose Solu-Medrol.        Assessment & Plan:  New diagnosis multiple sclerosis -Consult neurology, appreciate cares -Continue Solu-Medrol 1g daily, day 2 -PT evaluation        Disposition: The patient was admitted with new onset multiple sclerosis.   I will discharge when neurology have determined that he has completed his treatment, he has had a PT evaluation.        MDM: The below labs and imaging reports were reviewed and summarized above.  Medication management as above.   DVT prophylaxis: Lovenox Code Status: Full code Family Communication: Friend at the bedside    Consultants:   Neurology  Procedures:     Antimicrobials:       Subjective: Patient is feeling well, his numbness seems to be receding, he is able to sit on the side of the bed, feels better.  No new vision changes, headache, fever, ataxia.  Objective: Vitals:   08/01/19 1831 08/01/19 1925 08/02/19 0452 08/02/19 0801  BP: 116/65 115/67 110/80 105/71  Pulse: 75 78 66 (!) 56  Resp: Temp: 98.4 F (36.9 C) 98 F (36.7 C) 97.6 F (36.4 C) 97.8 F  (36.6 C)  TempSrc: Oral Oral Oral Oral  SpO2: 99% 99% 100% 100%  Weight:   61.9 kg   Height:        Intake/Output Summary (Last 24 hours) at 08/02/2019 1836 Last data filed at 08/02/2019 1316 Gross per 24 hour  Intake 78.81 ml  Output 470 ml  Net -391.19 ml   Filed Weights   07/31/19 1655 08/01/19 1643 08/02/19 0452  Weight: 63.5 kg 59.4 kg 61.9 kg    Examination: General appearance: Well-nourished adult male, alert and in no acute distress.  Sitting up in bed HEENT: Anicteric, conjunctiva pink, lids and lashes normal. No nasal deformity, discharge, epistaxis.  Lips moist.   Skin: Warm and dry.  No jaundice.  No suspicious rashes or lesions. Cardiac: RRR, nl S1-S2, no murmurs appreciated.  Capillary refill is brisk.  JVP normal.  No LE edema.  Radial pulses 2+ and symmetric. Respiratory: Normal respiratory rate and rhythm.  CTAB without rales or wheezes. Abdomen: Abdomen soft.  No TTP or guarding. No ascites, distension, hepatosplenomegaly.   MSK: No deformities or effusions. Neuro: Awake and alert.  EOMI, moves all extremities. Speech fluent.    Psych: Sensorium intact and responding to questions, attention normal. Affect normal.  Judgment and insight appear normal.    Data Reviewed: I have personally reviewed following labs and imaging studies:  CBC: Recent Labs  Lab 07/31/19 2007 08/01/19 0345  WBC 5.4 6.1  HGB 14.7  13.8  HCT 45.2 42.8  MCV 85.4 84.4  PLT 258 251   Basic Metabolic Panel: Recent Labs  Lab 07/31/19 2007 08/01/19 0345  NA 140 142  K 3.6 3.4*  CL 104 104  CO2 26 29  GLUCOSE 92 92  BUN 11 13  CREATININE 0.87 0.89  CALCIUM 9.5 9.7   GFR: Estimated Creatinine Clearance: 115.9 mL/min (by C-G formula based on SCr of 0.89 mg/dL). Liver Function Tests: Recent Labs  Lab 07/31/19 2007 07/31/19 2242 08/01/19 0345  AST 19  --  19  ALT 10  --  10  ALKPHOS 58  --  57  BILITOT 0.8  --  1.0  PROT 7.9  --  7.9  ALBUMIN 4.6 4.7 4.5   No results  for input(s): LIPASE, AMYLASE in the last 168 hours. No results for input(s): AMMONIA in the last 168 hours. Coagulation Profile: No results for input(s): INR, PROTIME in the last 168 hours. Cardiac Enzymes: Recent Labs  Lab 08/01/19 1710  CKTOTAL 131   BNP (last 3 results) No results for input(s): PROBNP in the last 8760 hours. HbA1C: Recent Labs    08/01/19 0345  HGBA1C 5.7*   CBG: No results for input(s): GLUCAP in the last 168 hours. Lipid Profile: No results for input(s): CHOL, HDL, LDLCALC, TRIG, CHOLHDL, LDLDIRECT in the last 72 hours. Thyroid Function Tests: Recent Labs    08/01/19 0345  TSH 1.992   Anemia Panel: Recent Labs    08/01/19 0345  VITAMINB12 286  FOLATE 7.8   Urine analysis:    Component Value Date/Time   COLORURINE YELLOW (A) 08/01/2019 1224   APPEARANCEUR CLEAR (A) 08/01/2019 1224   LABSPEC 1.036 (H) 08/01/2019 1224   PHURINE 5.0 08/01/2019 1224   GLUCOSEU NEGATIVE 08/01/2019 1224   HGBUR NEGATIVE 08/01/2019 1224   BILIRUBINUR NEGATIVE 08/01/2019 1224   KETONESUR 20 (A) 08/01/2019 1224   PROTEINUR 30 (A) 08/01/2019 1224   NITRITE NEGATIVE 08/01/2019 1224   LEUKOCYTESUR NEGATIVE 08/01/2019 1224   Sepsis Labs: @LABRCNTIP (procalcitonin:4,lacticacidven:4)  ) Recent Results (from the past 240 hour(s))  Respiratory Panel by RT PCR (Flu A&B, Covid) - Nasopharyngeal Swab     Status: None   Collection Time: 07/31/19 10:42 PM   Specimen: Nasopharyngeal Swab  Result Value Ref Range Status   SARS Coronavirus 2 by RT PCR NEGATIVE NEGATIVE Final    Comment: (NOTE) SARS-CoV-2 target nucleic acids are NOT DETECTED. The SARS-CoV-2 RNA is generally detectable in upper respiratoy specimens during the acute phase of infection. The lowest concentration of SARS-CoV-2 viral copies this assay can detect is 131 copies/mL. A negative result does not preclude SARS-Cov-2 infection and should not be used as the sole basis for treatment or other patient  management decisions. A negative result may occur with  improper specimen collection/handling, submission of specimen other than nasopharyngeal swab, presence of viral mutation(s) within the areas targeted by this assay, and inadequate number of viral copies (<131 copies/mL). A negative result must be combined with clinical observations, patient history, and epidemiological information. The expected result is Negative. Fact Sheet for Patients:  09/28/19 Fact Sheet for Healthcare Providers:  https://www.moore.com/ This test is not yet ap proved or cleared by the https://www.young.biz/ FDA and  has been authorized for detection and/or diagnosis of SARS-CoV-2 by FDA under an Emergency Use Authorization (EUA). This EUA will remain  in effect (meaning this test can be used) for the duration of the COVID-19 declaration under Section 564(b)(1) of the Act, 21  U.S.C. section 360bbb-3(b)(1), unless the authorization is terminated or revoked sooner.    Influenza A by PCR NEGATIVE NEGATIVE Final   Influenza B by PCR NEGATIVE NEGATIVE Final    Comment: (NOTE) The Xpert Xpress SARS-CoV-2/FLU/RSV assay is intended as an aid in  the diagnosis of influenza from Nasopharyngeal swab specimens and  should not be used as a sole basis for treatment. Nasal washings and  aspirates are unacceptable for Xpert Xpress SARS-CoV-2/FLU/RSV  testing. Fact Sheet for Patients: PinkCheek.be Fact Sheet for Healthcare Providers: GravelBags.it This test is not yet approved or cleared by the [place] FDA and  has been authorized for detection and/or diagnosis of SARS-CoV-2 by  FDA under an Emergency Use Authorization (EUA). This EUA will remain  in effect (meaning this test can be used) for the duration of the  Covid-19 declaration under Section 564(b)(1) of the Act, 21  U.S.C. section 360bbb-3(b)(1), unless the  authorization is  terminated or revoked. Performed at [facility], [address] [place], [place] [address]   CSF culture with Stat gram stain     Status: None (Preliminary result)   Collection Time: 07/31/19 10:42 PM   Specimen: CSF; Cerebrospinal Fluid  Result Value Ref Range Status   Specimen Description   Final    CSF Performed at [facility], [address] [place], [place] [address]    Special Requests   Final    NONE Performed at [facility], [address] [place], [place] [address]    Gram Stain   Final    WBC PRESENT, PREDOMINANTLY MONONUCLEAR NO ORGANISMS SEEN CYTOSPIN SMEAR    Culture   Final    NO GROWTH 1 DAY Performed at [facility], [address], [place], [place] [address]    Report Status PENDING  Incomplete         Radiology Studies: CT Head Wo Contrast  Result Date: 07/31/2019 CLINICAL DATA:  Ataxia, stroke suspected. EXAM: CT HEAD WITHOUT CONTRAST TECHNIQUE: Contiguous axial images were obtained from the base of the skull through the vertex without intravenous contrast. COMPARISON:  None. FINDINGS: Brain: No evidence of acute infarction, hemorrhage, hydrocephalus, extra-axial collection or mass lesion/mass effect. Vascular: No hyperdense vessel or unexpected calcification. Skull: No calvarial fracture or suspicious osseous lesion. No scalp swelling or hematoma. Sinuses/Orbits: Paranasal sinuses and mastoid air cells are predominantly clear. Included orbital structures are unremarkable. Other: None IMPRESSION: No acute intracranial abnormality. If persisting concern for infarct or ischemia, MRI is more sensitive and specific for early features. Electronically Signed   By: Lovena Le M.D.   On: 07/31/2019 20:35   MR BRAIN W WO CONTRAST  Result Date: 08/01/2019 CLINICAL DATA:  Initial evaluation for 5 day history of perianal numbness, bilateral lower extremity weakness and numbness, with inability to ambulate.  EXAM: MRI HEAD WITHOUT AND WITH CONTRAST MRI CERVICAL AND THORACIC SPINE WITHOUT AND WITH CONTRAST TECHNIQUE: Multiplanar and multiecho pulse sequences of the head, cervical spine, to include the craniocervical junction and cervicothoracic junction, and the thoracic spine, were obtained without and with intravenous contrast. CONTRAST:  50mL GADAVIST GADOBUTROL 1 MMOL/ML IV SOLN COMPARISON:  None. FINDINGS: MRI HEAD FINDINGS Brain: Cerebral volume within normal limits for age. Multifocal patchy T2/FLAIR signal abnormality seen involving the periventricular, deep, and juxta cortical white matter of both cerebral hemispheres. Several of these foci are oriented perpendicular to the lateral ventricles. Most prominent involvement seen at the left periatrial white matter. Patchy involvement seen about the  deep gray nuclei and internal capsules. Few scattered infratentorial foci involving the pons and brainstem noted. Associated T1 black holes. Constellation of findings most consistent with demyelinating disease/multiple sclerosis. Multiple scattered associated areas of peripheral and punctate enhancement seen about several of these lesions, consistent with active demyelination. Most prominent active plaque seen at the left periatrial white matter and measures 17 mm (series 49, image 81). No evidence for acute or subacute infarct. Gray-white matter differentiation maintained. No areas of remote cortical infarction. No evidence for acute or chronic intracranial hemorrhage. No mass lesion, midline shift or significant mass effect. No hydrocephalus. No extra-axial fluid collection. Pituitary gland suprasellar region normal. Midline structures intact. Vascular: Major intracranial vascular flow voids are well maintained. Skull and upper cervical spine: Craniocervical junction within normal limits. Heterogeneous T1 hypointense signal intensity seen within the clivus, favored to reflect heterogeneous marrow. No other focal marrow  replacing lesion. No scalp soft tissue abnormality. Sinuses/Orbits: Globes and orbital soft tissues within normal limits. Paranasal sinuses and mastoid air cells are clear. Inner ear structures grossly normal. Other: None. MRI CERVICAL SPINE FINDINGS Alignment: Straightening of the normal cervical lordosis. No listhesis. Vertebrae: Vertebral body height maintained without evidence for acute or chronic fracture. Bone marrow signal intensity within normal limits. No discrete or worrisome osseous lesions. No abnormal marrow edema or enhancement. Cord: Focal cord signal abnormality seen involving the central and dorsal aspect of the cord at the level of C3-4 (series 26, image 5). Associated cord expansion and enhancement compatible with active demyelination (series 51, image 8). No other definite signal of cord signal abnormality seen within the cervical spinal cord. Posterior Fossa, vertebral arteries, paraspinal tissues: Craniocervical junction normal. Paraspinous and prevertebral soft tissues within normal limits. Normal intravascular flow voids seen within the vertebral arteries bilaterally. Disc levels: No significant disc pathology seen within the cervical spine. No disc bulge or focal disc herniation. No significant facet degeneration. No canal or neural foraminal stenosis. MRI THORACIC SPINE FINDINGS Alignment: Vertebral bodies normally aligned with preservation of the normal thoracic kyphosis. No listhesis. Vertebrae: Vertebral body height maintained without evidence for acute or chronic fracture. Bone marrow signal intensity within normal limits. No discrete or worrisome osseous lesions. No abnormal marrow edema or enhancement. Cord: Patchy T2/STIR signal abnormality seen within the central aspect of the cord at the levels of T10, T11, and T12, with involvement of the conus. Associated postcontrast enhancement consistent with active demyelination (series 57, image 11). Remainder of the thoracic spinal cord is  within normal limits. Paraspinal and other soft tissues: Paraspinous soft tissues demonstrate no acute finding. Small T2 hyperintense simple cyst noted within the left kidney. Visualized visceral structures otherwise unremarkable. Disc levels: No significant disc pathology seen within the thoracic spine. No stenosis or impingement. IMPRESSION: MRI HEAD IMPRESSION: 1. Extensive patchy T2/FLAIR signal abnormality involving the supratentorial and infratentorial cerebral white matter, consistent with demyelinating disease/multiple sclerosis. Associated post-contrast enhancement about multiple lesions consistent with active demyelination. 2. Otherwise negative brain MRI for age. MRI CERVICAL SPINE IMPRESSION: 1. Patchy cord signal abnormality at the level of C3-4, consistent with demyelinating disease/multiple sclerosis. Associated postcontrast enhancement compatible with active demyelination. 2. Otherwise normal MRI of the cervical spine. MRI THORACIC SPINE IMPRESSION: 1. Multifocal cord signal abnormality involving the lower thoracic spinal cord extending from T10 through the conus, compatible with demyelinating disease/multiple sclerosis. Associated postcontrast enhancement compatible with active demyelination. 2. Otherwise normal MRI of the thoracic spine. Electronically Signed   By: Rise Mu M.D.   On:  08/01/2019 03:32   MR LUMBAR SPINE WO CONTRAST  Result Date: 07/31/2019 CLINICAL DATA:  Initial evaluation for acute low back pain, progressive neurologic deficit with saddle anesthesia, bilateral leg numbness and weakness. EXAM: MRI LUMBAR SPINE WITHOUT CONTRAST TECHNIQUE: Multiplanar, multisequence MR imaging of the lumbar spine was performed. No intravenous contrast was administered. COMPARISON:  None. FINDINGS: Segmentation: Standard. Lowest well-formed disc space labeled the L5-S1 level. Alignment: Physiologic with preservation of the normal lumbar lordosis. No listhesis. Vertebrae: Vertebral body  height maintained without evidence for acute or chronic fracture. Bone marrow signal intensity within normal limits. No discrete or worrisome osseous lesions. No abnormal marrow edema. Conus medullaris and cauda equina: Conus extends to the T12 level. Conus and cauda equina appear normal. Paraspinal and other soft tissues: Paraspinous soft tissues within normal limits. 11 mm simple cyst noted within the interpolar left kidney. Visualized visceral structures otherwise unremarkable. Disc levels: No significant disc pathology seen within the lumbar spine. Intervertebral discs are well hydrated with preserved disc height. No disc bulge or focal disc protrusion. No significant facet degeneration. No canal or neural foraminal stenosis. No neural impingement. IMPRESSION: Normal MRI of the lumbar spine. No findings to explain patient's symptoms identified. Electronically Signed   By: Rise Mu M.D.   On: 07/31/2019 21:05   MR CERVICAL SPINE W WO CONTRAST  Result Date: 08/01/2019 CLINICAL DATA:  Initial evaluation for 5 day history of perianal numbness, bilateral lower extremity weakness and numbness, with inability to ambulate. EXAM: MRI HEAD WITHOUT AND WITH CONTRAST MRI CERVICAL AND THORACIC SPINE WITHOUT AND WITH CONTRAST TECHNIQUE: Multiplanar and multiecho pulse sequences of the head, cervical spine, to include the craniocervical junction and cervicothoracic junction, and the thoracic spine, were obtained without and with intravenous contrast. CONTRAST:  6mL GADAVIST GADOBUTROL 1 MMOL/ML IV SOLN COMPARISON:  None. FINDINGS: MRI HEAD FINDINGS Brain: Cerebral volume within normal limits for age. Multifocal patchy T2/FLAIR signal abnormality seen involving the periventricular, deep, and juxta cortical white matter of both cerebral hemispheres. Several of these foci are oriented perpendicular to the lateral ventricles. Most prominent involvement seen at the left periatrial white matter. Patchy involvement  seen about the deep gray nuclei and internal capsules. Few scattered infratentorial foci involving the pons and brainstem noted. Associated T1 black holes. Constellation of findings most consistent with demyelinating disease/multiple sclerosis. Multiple scattered associated areas of peripheral and punctate enhancement seen about several of these lesions, consistent with active demyelination. Most prominent active plaque seen at the left periatrial white matter and measures 17 mm (series 49, image 81). No evidence for acute or subacute infarct. Gray-white matter differentiation maintained. No areas of remote cortical infarction. No evidence for acute or chronic intracranial hemorrhage. No mass lesion, midline shift or significant mass effect. No hydrocephalus. No extra-axial fluid collection. Pituitary gland suprasellar region normal. Midline structures intact. Vascular: Major intracranial vascular flow voids are well maintained. Skull and upper cervical spine: Craniocervical junction within normal limits. Heterogeneous T1 hypointense signal intensity seen within the clivus, favored to reflect heterogeneous marrow. No other focal marrow replacing lesion. No scalp soft tissue abnormality. Sinuses/Orbits: Globes and orbital soft tissues within normal limits. Paranasal sinuses and mastoid air cells are clear. Inner ear structures grossly normal. Other: None. MRI CERVICAL SPINE FINDINGS Alignment: Straightening of the normal cervical lordosis. No listhesis. Vertebrae: Vertebral body height maintained without evidence for acute or chronic fracture. Bone marrow signal intensity within normal limits. No discrete or worrisome osseous lesions. No abnormal marrow edema or enhancement. Cord:  Focal cord signal abnormality seen involving the central and dorsal aspect of the cord at the level of C3-4 (series 26, image 5). Associated cord expansion and enhancement compatible with active demyelination (series 51, image 8). No other  definite signal of cord signal abnormality seen within the cervical spinal cord. Posterior Fossa, vertebral arteries, paraspinal tissues: Craniocervical junction normal. Paraspinous and prevertebral soft tissues within normal limits. Normal intravascular flow voids seen within the vertebral arteries bilaterally. Disc levels: No significant disc pathology seen within the cervical spine. No disc bulge or focal disc herniation. No significant facet degeneration. No canal or neural foraminal stenosis. MRI THORACIC SPINE FINDINGS Alignment: Vertebral bodies normally aligned with preservation of the normal thoracic kyphosis. No listhesis. Vertebrae: Vertebral body height maintained without evidence for acute or chronic fracture. Bone marrow signal intensity within normal limits. No discrete or worrisome osseous lesions. No abnormal marrow edema or enhancement. Cord: Patchy T2/STIR signal abnormality seen within the central aspect of the cord at the levels of T10, T11, and T12, with involvement of the conus. Associated postcontrast enhancement consistent with active demyelination (series 57, image 11). Remainder of the thoracic spinal cord is within normal limits. Paraspinal and other soft tissues: Paraspinous soft tissues demonstrate no acute finding. Small T2 hyperintense simple cyst noted within the left kidney. Visualized visceral structures otherwise unremarkable. Disc levels: No significant disc pathology seen within the thoracic spine. No stenosis or impingement. IMPRESSION: MRI HEAD IMPRESSION: 1. Extensive patchy T2/FLAIR signal abnormality involving the supratentorial and infratentorial cerebral white matter, consistent with demyelinating disease/multiple sclerosis. Associated post-contrast enhancement about multiple lesions consistent with active demyelination. 2. Otherwise negative brain MRI for age. MRI CERVICAL SPINE IMPRESSION: 1. Patchy cord signal abnormality at the level of C3-4, consistent with  demyelinating disease/multiple sclerosis. Associated postcontrast enhancement compatible with active demyelination. 2. Otherwise normal MRI of the cervical spine. MRI THORACIC SPINE IMPRESSION: 1. Multifocal cord signal abnormality involving the lower thoracic spinal cord extending from T10 through the conus, compatible with demyelinating disease/multiple sclerosis. Associated postcontrast enhancement compatible with active demyelination. 2. Otherwise normal MRI of the thoracic spine. Electronically Signed   By: Rise Mu M.D.   On: 08/01/2019 03:32   MR THORACIC SPINE W WO CONTRAST  Result Date: 08/01/2019 CLINICAL DATA:  Initial evaluation for 5 day history of perianal numbness, bilateral lower extremity weakness and numbness, with inability to ambulate. EXAM: MRI HEAD WITHOUT AND WITH CONTRAST MRI CERVICAL AND THORACIC SPINE WITHOUT AND WITH CONTRAST TECHNIQUE: Multiplanar and multiecho pulse sequences of the head, cervical spine, to include the craniocervical junction and cervicothoracic junction, and the thoracic spine, were obtained without and with intravenous contrast. CONTRAST:  6mL GADAVIST GADOBUTROL 1 MMOL/ML IV SOLN COMPARISON:  None. FINDINGS: MRI HEAD FINDINGS Brain: Cerebral volume within normal limits for age. Multifocal patchy T2/FLAIR signal abnormality seen involving the periventricular, deep, and juxta cortical white matter of both cerebral hemispheres. Several of these foci are oriented perpendicular to the lateral ventricles. Most prominent involvement seen at the left periatrial white matter. Patchy involvement seen about the deep gray nuclei and internal capsules. Few scattered infratentorial foci involving the pons and brainstem noted. Associated T1 black holes. Constellation of findings most consistent with demyelinating disease/multiple sclerosis. Multiple scattered associated areas of peripheral and punctate enhancement seen about several of these lesions, consistent with  active demyelination. Most prominent active plaque seen at the left periatrial white matter and measures 17 mm (series 49, image 81). No evidence for acute or subacute infarct. Gray-white matter  differentiation maintained. No areas of remote cortical infarction. No evidence for acute or chronic intracranial hemorrhage. No mass lesion, midline shift or significant mass effect. No hydrocephalus. No extra-axial fluid collection. Pituitary gland suprasellar region normal. Midline structures intact. Vascular: Major intracranial vascular flow voids are well maintained. Skull and upper cervical spine: Craniocervical junction within normal limits. Heterogeneous T1 hypointense signal intensity seen within the clivus, favored to reflect heterogeneous marrow. No other focal marrow replacing lesion. No scalp soft tissue abnormality. Sinuses/Orbits: Globes and orbital soft tissues within normal limits. Paranasal sinuses and mastoid air cells are clear. Inner ear structures grossly normal. Other: None. MRI CERVICAL SPINE FINDINGS Alignment: Straightening of the normal cervical lordosis. No listhesis. Vertebrae: Vertebral body height maintained without evidence for acute or chronic fracture. Bone marrow signal intensity within normal limits. No discrete or worrisome osseous lesions. No abnormal marrow edema or enhancement. Cord: Focal cord signal abnormality seen involving the central and dorsal aspect of the cord at the level of C3-4 (series 26, image 5). Associated cord expansion and enhancement compatible with active demyelination (series 51, image 8). No other definite signal of cord signal abnormality seen within the cervical spinal cord. Posterior Fossa, vertebral arteries, paraspinal tissues: Craniocervical junction normal. Paraspinous and prevertebral soft tissues within normal limits. Normal intravascular flow voids seen within the vertebral arteries bilaterally. Disc levels: No significant disc pathology seen within the  cervical spine. No disc bulge or focal disc herniation. No significant facet degeneration. No canal or neural foraminal stenosis. MRI THORACIC SPINE FINDINGS Alignment: Vertebral bodies normally aligned with preservation of the normal thoracic kyphosis. No listhesis. Vertebrae: Vertebral body height maintained without evidence for acute or chronic fracture. Bone marrow signal intensity within normal limits. No discrete or worrisome osseous lesions. No abnormal marrow edema or enhancement. Cord: Patchy T2/STIR signal abnormality seen within the central aspect of the cord at the levels of T10, T11, and T12, with involvement of the conus. Associated postcontrast enhancement consistent with active demyelination (series 57, image 11). Remainder of the thoracic spinal cord is within normal limits. Paraspinal and other soft tissues: Paraspinous soft tissues demonstrate no acute finding. Small T2 hyperintense simple cyst noted within the left kidney. Visualized visceral structures otherwise unremarkable. Disc levels: No significant disc pathology seen within the thoracic spine. No stenosis or impingement. IMPRESSION: MRI HEAD IMPRESSION: 1. Extensive patchy T2/FLAIR signal abnormality involving the supratentorial and infratentorial cerebral white matter, consistent with demyelinating disease/multiple sclerosis. Associated post-contrast enhancement about multiple lesions consistent with active demyelination. 2. Otherwise negative brain MRI for age. MRI CERVICAL SPINE IMPRESSION: 1. Patchy cord signal abnormality at the level of C3-4, consistent with demyelinating disease/multiple sclerosis. Associated postcontrast enhancement compatible with active demyelination. 2. Otherwise normal MRI of the cervical spine. MRI THORACIC SPINE IMPRESSION: 1. Multifocal cord signal abnormality involving the lower thoracic spinal cord extending from T10 through the conus, compatible with demyelinating disease/multiple sclerosis. Associated  postcontrast enhancement compatible with active demyelination. 2. Otherwise normal MRI of the thoracic spine. Electronically Signed   By: Rise Mu M.D.   On: 08/01/2019 03:32        Scheduled Meds:  [START ON 08/03/2019] enoxaparin (LOVENOX) injection  40 mg Subcutaneous Q24H   pantoprazole  40 mg Oral Daily   sodium chloride flush  10 mL Intravenous Q12H   Continuous Infusions:  sodium chloride Stopped (08/02/19 0857)   methylPREDNISolone (SOLU-MEDROL) injection Stopped (08/02/19 0653)     LOS: 1 day    Time spent: 25 minutes  Alberteen Sam, MD [facility] 08/02/2019, 6:36 PM     Please page though AMION or Epic secure chat:  For [employer], Higher education careers adviser

## 2019-08-02 NOTE — Plan of Care (Signed)

## 2019-08-02 NOTE — Progress Notes (Signed)
Pt NIF done with good effort  -40cmH20

## 2019-08-02 NOTE — Progress Notes (Signed)
NIF completed: Attempt 1: -32 cmH20 Attempt 2: -35 cmH20 Attempt 3: -36 cmH20  Incentive also given to patient and instructed on proper use. Patient pulled slow steady breath of  2500+mL.

## 2019-08-03 LAB — ANA COMPREHENSIVE PANEL

## 2019-08-03 LAB — HIGH SENSITIVITY CRP: CRP, High Sensitivity: 0.35 mg/L (ref 0.00–3.00)

## 2019-08-03 LAB — OLIGOCLONAL BANDS, CSF + SERM

## 2019-08-03 MED ORDER — PREDNISONE 50 MG PO TABS: 1250.0000 mg | ORAL_TABLET | Freq: Every day | ORAL | Status: DC

## 2019-08-03 MED ORDER — PREDNISONE 50 MG PO TABS: 1250.0000 mg | ORAL_TABLET | Freq: Every day | ORAL | 0 refills | Status: AC

## 2019-08-03 NOTE — Discharge Summary (Signed)
Physician Discharge Summary  Cletus Fischbach WUJ:[id number] DOB: [birth year] DOA: 07/31/2019  PCP: Patient, No Pcp Per  Admit date: 07/31/2019 Discharge date: 08/03/2019  Admitted From: Home  Disposition:  Home   Recommendations for Outpatient Follow-up:  1. Follow up with Dr. Felecia Shelling as soon as able 2. Follow up with Neuro PT in [facility] 3.      Home Health: None  Equipment/Devices: None  Discharge Condition: Good  CODE STATUS: FULL Diet recommendation: Regular  Brief/Interim Summary: Mr. Birdsell is a 19 y.o. M with no significant past medical history who presented with several days bilateral feet numbness, saddle anesthesia, and difficulty walking.  Of note, the patient had a similar episode in 2018, where he had leg numbness "like if you been sitting on the toilet too long" the gradually improved by itself over several weeks, as well as an episode of diplopia in early 2020 that also improved by itself after several weeks.  In the ER here, the patient had an LP that showed mild pleocytosis, and an MRI that showed extensive patchy T2/flair signal abnormality in the supratentorial and infratentorial white matter.  Case was discussed with neurology recommended admission for high-dose Solu-Medrol.     PRINCIPAL HOSPITAL DIAGNOSIS: Multiple sclerosis flare, new diagnosis    Discharge Diagnoses:   New diagnosis multiple sclerosis Patient admitted and started on Solu-Medrol 1g daily.  Neurology were consulted.  LP showed five oligoclonal bands not present in serum.  Rheum panel without hits.  CSF culture negative.    The patient made rapid improvement in his paresthesias with Solu-medrol and had no hypertension.  He was discharged to complete 5 days high dose steroids total with prednisone 1250 mg once daily for 2 more days.  Follow up was arranged with MS specialist, Dr. Felecia Shelling, expertise appreciated.             Discharge Instructions  Discharge Instructions     Ambulatory referral to Neurology   Complete by: As directed    An appointment is requested in approximately: 1-2 weeks   Discharge instructions   Complete by: As directed    From Dr. Loleta Books: You were admitted with what appears to be MS, multiple sclerosis.  You were treated with "high dose Solumedrol", and you should follow up closely with Dr. Arlice Colt at [facility] in [place].  If you don't have the details about your follow up appointment already, please call Dr. Garth Bigness office today to arrange this referral.  If needed, explain that you were seen by Drs. Myrene Buddy and Karena Addison Aroor at [facility] and we made this referral.   Increase activity slowly   Complete by: As directed      Allergies as of 08/03/2019   No Known Allergies     Medication List    TAKE these medications   predniSONE 50 MG tablet Commonly known as: DELTASONE Take 25 tablets (1,250 mg total) by mouth daily with breakfast for 2 days.      Follow-up Information    Sater, Nanine Means, MD. Schedule an appointment as soon as possible for a visit in 2 week(s).   Specialty: Neurology Contact information: [address] [place] [address] [phone]          No Known Allergies  Consultations:  Neurology   Procedures/Studies: CT Head Wo Contrast  Result Date: 07/31/2019 CLINICAL DATA:  Ataxia, stroke suspected. EXAM: CT HEAD WITHOUT CONTRAST TECHNIQUE: Contiguous axial images were obtained from the base of the skull through the vertex without  intravenous contrast. COMPARISON:  None. FINDINGS: Brain: No evidence of acute infarction, hemorrhage, hydrocephalus, extra-axial collection or mass lesion/mass effect. Vascular: No hyperdense vessel or unexpected calcification. Skull: No calvarial fracture or suspicious osseous lesion. No scalp swelling or hematoma. Sinuses/Orbits: Paranasal sinuses and mastoid air cells are predominantly clear. Included orbital structures are unremarkable.  Other: None IMPRESSION: No acute intracranial abnormality. If persisting concern for infarct or ischemia, MRI is more sensitive and specific for early features. Electronically Signed   By: Kreg Shropshire M.D.   On: 07/31/2019 20:35   MR BRAIN W WO CONTRAST  Result Date: 08/01/2019 CLINICAL DATA:  Initial evaluation for 5 day history of perianal numbness, bilateral lower extremity weakness and numbness, with inability to ambulate. EXAM: MRI HEAD WITHOUT AND WITH CONTRAST MRI CERVICAL AND THORACIC SPINE WITHOUT AND WITH CONTRAST TECHNIQUE: Multiplanar and multiecho pulse sequences of the head, cervical spine, to include the craniocervical junction and cervicothoracic junction, and the thoracic spine, were obtained without and with intravenous contrast. CONTRAST:  6mL GADAVIST GADOBUTROL 1 MMOL/ML IV SOLN COMPARISON:  None. FINDINGS: MRI HEAD FINDINGS Brain: Cerebral volume within normal limits for age. Multifocal patchy T2/FLAIR signal abnormality seen involving the periventricular, deep, and juxta cortical white matter of both cerebral hemispheres. Several of these foci are oriented perpendicular to the lateral ventricles. Most prominent involvement seen at the left periatrial white matter. Patchy involvement seen about the deep gray nuclei and internal capsules. Few scattered infratentorial foci involving the pons and brainstem noted. Associated T1 black holes. Constellation of findings most consistent with demyelinating disease/multiple sclerosis. Multiple scattered associated areas of peripheral and punctate enhancement seen about several of these lesions, consistent with active demyelination. Most prominent active plaque seen at the left periatrial white matter and measures 17 mm (series 49, image 81). No evidence for acute or subacute infarct. Gray-white matter differentiation maintained. No areas of remote cortical infarction. No evidence for acute or chronic intracranial hemorrhage. No mass lesion, midline  shift or significant mass effect. No hydrocephalus. No extra-axial fluid collection. Pituitary gland suprasellar region normal. Midline structures intact. Vascular: Major intracranial vascular flow voids are well maintained. Skull and upper cervical spine: Craniocervical junction within normal limits. Heterogeneous T1 hypointense signal intensity seen within the clivus, favored to reflect heterogeneous marrow. No other focal marrow replacing lesion. No scalp soft tissue abnormality. Sinuses/Orbits: Globes and orbital soft tissues within normal limits. Paranasal sinuses and mastoid air cells are clear. Inner ear structures grossly normal. Other: None. MRI CERVICAL SPINE FINDINGS Alignment: Straightening of the normal cervical lordosis. No listhesis. Vertebrae: Vertebral body height maintained without evidence for acute or chronic fracture. Bone marrow signal intensity within normal limits. No discrete or worrisome osseous lesions. No abnormal marrow edema or enhancement. Cord: Focal cord signal abnormality seen involving the central and dorsal aspect of the cord at the level of C3-4 (series 26, image 5). Associated cord expansion and enhancement compatible with active demyelination (series 51, image 8). No other definite signal of cord signal abnormality seen within the cervical spinal cord. Posterior Fossa, vertebral arteries, paraspinal tissues: Craniocervical junction normal. Paraspinous and prevertebral soft tissues within normal limits. Normal intravascular flow voids seen within the vertebral arteries bilaterally. Disc levels: No significant disc pathology seen within the cervical spine. No disc bulge or focal disc herniation. No significant facet degeneration. No canal or neural foraminal stenosis. MRI THORACIC SPINE FINDINGS Alignment: Vertebral bodies normally aligned with preservation of the normal thoracic kyphosis. No listhesis. Vertebrae: Vertebral body height maintained without evidence  for acute or  chronic fracture. Bone marrow signal intensity within normal limits. No discrete or worrisome osseous lesions. No abnormal marrow edema or enhancement. Cord: Patchy T2/STIR signal abnormality seen within the central aspect of the cord at the levels of T10, T11, and T12, with involvement of the conus. Associated postcontrast enhancement consistent with active demyelination (series 57, image 11). Remainder of the thoracic spinal cord is within normal limits. Paraspinal and other soft tissues: Paraspinous soft tissues demonstrate no acute finding. Small T2 hyperintense simple cyst noted within the left kidney. Visualized visceral structures otherwise unremarkable. Disc levels: No significant disc pathology seen within the thoracic spine. No stenosis or impingement. IMPRESSION: MRI HEAD IMPRESSION: 1. Extensive patchy T2/FLAIR signal abnormality involving the supratentorial and infratentorial cerebral white matter, consistent with demyelinating disease/multiple sclerosis. Associated post-contrast enhancement about multiple lesions consistent with active demyelination. 2. Otherwise negative brain MRI for age. MRI CERVICAL SPINE IMPRESSION: 1. Patchy cord signal abnormality at the level of C3-4, consistent with demyelinating disease/multiple sclerosis. Associated postcontrast enhancement compatible with active demyelination. 2. Otherwise normal MRI of the cervical spine. MRI THORACIC SPINE IMPRESSION: 1. Multifocal cord signal abnormality involving the lower thoracic spinal cord extending from T10 through the conus, compatible with demyelinating disease/multiple sclerosis. Associated postcontrast enhancement compatible with active demyelination. 2. Otherwise normal MRI of the thoracic spine. Electronically Signed   By: Rise Mu M.D.   On: 08/01/2019 03:32   MR LUMBAR SPINE WO CONTRAST  Result Date: 07/31/2019 CLINICAL DATA:  Initial evaluation for acute low back pain, progressive neurologic deficit with  saddle anesthesia, bilateral leg numbness and weakness. EXAM: MRI LUMBAR SPINE WITHOUT CONTRAST TECHNIQUE: Multiplanar, multisequence MR imaging of the lumbar spine was performed. No intravenous contrast was administered. COMPARISON:  None. FINDINGS: Segmentation: Standard. Lowest well-formed disc space labeled the L5-S1 level. Alignment: Physiologic with preservation of the normal lumbar lordosis. No listhesis. Vertebrae: Vertebral body height maintained without evidence for acute or chronic fracture. Bone marrow signal intensity within normal limits. No discrete or worrisome osseous lesions. No abnormal marrow edema. Conus medullaris and cauda equina: Conus extends to the T12 level. Conus and cauda equina appear normal. Paraspinal and other soft tissues: Paraspinous soft tissues within normal limits. 11 mm simple cyst noted within the interpolar left kidney. Visualized visceral structures otherwise unremarkable. Disc levels: No significant disc pathology seen within the lumbar spine. Intervertebral discs are well hydrated with preserved disc height. No disc bulge or focal disc protrusion. No significant facet degeneration. No canal or neural foraminal stenosis. No neural impingement. IMPRESSION: Normal MRI of the lumbar spine. No findings to explain patient's symptoms identified. Electronically Signed   By: Rise Mu M.D.   On: 07/31/2019 21:05   MR CERVICAL SPINE W WO CONTRAST  Result Date: 08/01/2019 CLINICAL DATA:  Initial evaluation for 5 day history of perianal numbness, bilateral lower extremity weakness and numbness, with inability to ambulate. EXAM: MRI HEAD WITHOUT AND WITH CONTRAST MRI CERVICAL AND THORACIC SPINE WITHOUT AND WITH CONTRAST TECHNIQUE: Multiplanar and multiecho pulse sequences of the head, cervical spine, to include the craniocervical junction and cervicothoracic junction, and the thoracic spine, were obtained without and with intravenous contrast. CONTRAST:  24mL GADAVIST  GADOBUTROL 1 MMOL/ML IV SOLN COMPARISON:  None. FINDINGS: MRI HEAD FINDINGS Brain: Cerebral volume within normal limits for age. Multifocal patchy T2/FLAIR signal abnormality seen involving the periventricular, deep, and juxta cortical white matter of both cerebral hemispheres. Several of these foci are oriented perpendicular to the lateral ventricles. Most prominent  involvement seen at the left periatrial white matter. Patchy involvement seen about the deep gray nuclei and internal capsules. Few scattered infratentorial foci involving the pons and brainstem noted. Associated T1 black holes. Constellation of findings most consistent with demyelinating disease/multiple sclerosis. Multiple scattered associated areas of peripheral and punctate enhancement seen about several of these lesions, consistent with active demyelination. Most prominent active plaque seen at the left periatrial white matter and measures 17 mm (series 49, image 81). No evidence for acute or subacute infarct. Gray-white matter differentiation maintained. No areas of remote cortical infarction. No evidence for acute or chronic intracranial hemorrhage. No mass lesion, midline shift or significant mass effect. No hydrocephalus. No extra-axial fluid collection. Pituitary gland suprasellar region normal. Midline structures intact. Vascular: Major intracranial vascular flow voids are well maintained. Skull and upper cervical spine: Craniocervical junction within normal limits. Heterogeneous T1 hypointense signal intensity seen within the clivus, favored to reflect heterogeneous marrow. No other focal marrow replacing lesion. No scalp soft tissue abnormality. Sinuses/Orbits: Globes and orbital soft tissues within normal limits. Paranasal sinuses and mastoid air cells are clear. Inner ear structures grossly normal. Other: None. MRI CERVICAL SPINE FINDINGS Alignment: Straightening of the normal cervical lordosis. No listhesis. Vertebrae: Vertebral body  height maintained without evidence for acute or chronic fracture. Bone marrow signal intensity within normal limits. No discrete or worrisome osseous lesions. No abnormal marrow edema or enhancement. Cord: Focal cord signal abnormality seen involving the central and dorsal aspect of the cord at the level of C3-4 (series 26, image 5). Associated cord expansion and enhancement compatible with active demyelination (series 51, image 8). No other definite signal of cord signal abnormality seen within the cervical spinal cord. Posterior Fossa, vertebral arteries, paraspinal tissues: Craniocervical junction normal. Paraspinous and prevertebral soft tissues within normal limits. Normal intravascular flow voids seen within the vertebral arteries bilaterally. Disc levels: No significant disc pathology seen within the cervical spine. No disc bulge or focal disc herniation. No significant facet degeneration. No canal or neural foraminal stenosis. MRI THORACIC SPINE FINDINGS Alignment: Vertebral bodies normally aligned with preservation of the normal thoracic kyphosis. No listhesis. Vertebrae: Vertebral body height maintained without evidence for acute or chronic fracture. Bone marrow signal intensity within normal limits. No discrete or worrisome osseous lesions. No abnormal marrow edema or enhancement. Cord: Patchy T2/STIR signal abnormality seen within the central aspect of the cord at the levels of T10, T11, and T12, with involvement of the conus. Associated postcontrast enhancement consistent with active demyelination (series 57, image 11). Remainder of the thoracic spinal cord is within normal limits. Paraspinal and other soft tissues: Paraspinous soft tissues demonstrate no acute finding. Small T2 hyperintense simple cyst noted within the left kidney. Visualized visceral structures otherwise unremarkable. Disc levels: No significant disc pathology seen within the thoracic spine. No stenosis or impingement. IMPRESSION: MRI  HEAD IMPRESSION: 1. Extensive patchy T2/FLAIR signal abnormality involving the supratentorial and infratentorial cerebral white matter, consistent with demyelinating disease/multiple sclerosis. Associated post-contrast enhancement about multiple lesions consistent with active demyelination. 2. Otherwise negative brain MRI for age. MRI CERVICAL SPINE IMPRESSION: 1. Patchy cord signal abnormality at the level of C3-4, consistent with demyelinating disease/multiple sclerosis. Associated postcontrast enhancement compatible with active demyelination. 2. Otherwise normal MRI of the cervical spine. MRI THORACIC SPINE IMPRESSION: 1. Multifocal cord signal abnormality involving the lower thoracic spinal cord extending from T10 through the conus, compatible with demyelinating disease/multiple sclerosis. Associated postcontrast enhancement compatible with active demyelination. 2. Otherwise normal MRI of the thoracic  spine. Electronically Signed   By: Rise Mu M.D.   On: 08/01/2019 03:32   MR THORACIC SPINE W WO CONTRAST  Result Date: 08/01/2019 CLINICAL DATA:  Initial evaluation for 5 day history of perianal numbness, bilateral lower extremity weakness and numbness, with inability to ambulate. EXAM: MRI HEAD WITHOUT AND WITH CONTRAST MRI CERVICAL AND THORACIC SPINE WITHOUT AND WITH CONTRAST TECHNIQUE: Multiplanar and multiecho pulse sequences of the head, cervical spine, to include the craniocervical junction and cervicothoracic junction, and the thoracic spine, were obtained without and with intravenous contrast. CONTRAST:  6mL GADAVIST GADOBUTROL 1 MMOL/ML IV SOLN COMPARISON:  None. FINDINGS: MRI HEAD FINDINGS Brain: Cerebral volume within normal limits for age. Multifocal patchy T2/FLAIR signal abnormality seen involving the periventricular, deep, and juxta cortical white matter of both cerebral hemispheres. Several of these foci are oriented perpendicular to the lateral ventricles. Most prominent  involvement seen at the left periatrial white matter. Patchy involvement seen about the deep gray nuclei and internal capsules. Few scattered infratentorial foci involving the pons and brainstem noted. Associated T1 black holes. Constellation of findings most consistent with demyelinating disease/multiple sclerosis. Multiple scattered associated areas of peripheral and punctate enhancement seen about several of these lesions, consistent with active demyelination. Most prominent active plaque seen at the left periatrial white matter and measures 17 mm (series 49, image 81). No evidence for acute or subacute infarct. Gray-white matter differentiation maintained. No areas of remote cortical infarction. No evidence for acute or chronic intracranial hemorrhage. No mass lesion, midline shift or significant mass effect. No hydrocephalus. No extra-axial fluid collection. Pituitary gland suprasellar region normal. Midline structures intact. Vascular: Major intracranial vascular flow voids are well maintained. Skull and upper cervical spine: Craniocervical junction within normal limits. Heterogeneous T1 hypointense signal intensity seen within the clivus, favored to reflect heterogeneous marrow. No other focal marrow replacing lesion. No scalp soft tissue abnormality. Sinuses/Orbits: Globes and orbital soft tissues within normal limits. Paranasal sinuses and mastoid air cells are clear. Inner ear structures grossly normal. Other: None. MRI CERVICAL SPINE FINDINGS Alignment: Straightening of the normal cervical lordosis. No listhesis. Vertebrae: Vertebral body height maintained without evidence for acute or chronic fracture. Bone marrow signal intensity within normal limits. No discrete or worrisome osseous lesions. No abnormal marrow edema or enhancement. Cord: Focal cord signal abnormality seen involving the central and dorsal aspect of the cord at the level of C3-4 (series 26, image 5). Associated cord expansion and  enhancement compatible with active demyelination (series 51, image 8). No other definite signal of cord signal abnormality seen within the cervical spinal cord. Posterior Fossa, vertebral arteries, paraspinal tissues: Craniocervical junction normal. Paraspinous and prevertebral soft tissues within normal limits. Normal intravascular flow voids seen within the vertebral arteries bilaterally. Disc levels: No significant disc pathology seen within the cervical spine. No disc bulge or focal disc herniation. No significant facet degeneration. No canal or neural foraminal stenosis. MRI THORACIC SPINE FINDINGS Alignment: Vertebral bodies normally aligned with preservation of the normal thoracic kyphosis. No listhesis. Vertebrae: Vertebral body height maintained without evidence for acute or chronic fracture. Bone marrow signal intensity within normal limits. No discrete or worrisome osseous lesions. No abnormal marrow edema or enhancement. Cord: Patchy T2/STIR signal abnormality seen within the central aspect of the cord at the levels of T10, T11, and T12, with involvement of the conus. Associated postcontrast enhancement consistent with active demyelination (series 57, image 11). Remainder of the thoracic spinal cord is within normal limits. Paraspinal and other soft tissues:  Paraspinous soft tissues demonstrate no acute finding. Small T2 hyperintense simple cyst noted within the left kidney. Visualized visceral structures otherwise unremarkable. Disc levels: No significant disc pathology seen within the thoracic spine. No stenosis or impingement. IMPRESSION: MRI HEAD IMPRESSION: 1. Extensive patchy T2/FLAIR signal abnormality involving the supratentorial and infratentorial cerebral white matter, consistent with demyelinating disease/multiple sclerosis. Associated post-contrast enhancement about multiple lesions consistent with active demyelination. 2. Otherwise negative brain MRI for age. MRI CERVICAL SPINE IMPRESSION:  1. Patchy cord signal abnormality at the level of C3-4, consistent with demyelinating disease/multiple sclerosis. Associated postcontrast enhancement compatible with active demyelination. 2. Otherwise normal MRI of the cervical spine. MRI THORACIC SPINE IMPRESSION: 1. Multifocal cord signal abnormality involving the lower thoracic spinal cord extending from T10 through the conus, compatible with demyelinating disease/multiple sclerosis. Associated postcontrast enhancement compatible with active demyelination. 2. Otherwise normal MRI of the thoracic spine. Electronically Signed   By: Rise Mu M.D.   On: 08/01/2019 03:32       Subjective: Patient feeling well.  Able to walk without ataxia, paresthesias are diminishing.  No new vision changes, focal weakness, numbness, paresthesias, headache, nausea, hypertension.  Discharge Exam: Vitals:   08/03/19 0802 08/03/19 1632  BP: 132/67 113/64  Pulse: (!) 58 (!) 59  Resp: 20 18  Temp: 98.1 F (36.7 C) 98 F (36.7 C)  SpO2: 100% 100%   Vitals:   08/02/19 2019 08/03/19 0412 08/03/19 0802 08/03/19 1632  BP: 122/76 108/68 132/67 113/64  Pulse: 68 81 (!) 58 (!) 59  Resp: 20 20 20 18   Temp: 97.8 F (36.6 C) 97.8 F (36.6 C) 98.1 F (36.7 C) 98 F (36.7 C)  TempSrc: Oral Oral Oral Oral  SpO2: 97% 100% 100% 100%  Weight:  61.9 kg    Height:        General: Pt is alert, awake, not in acute distress Cardiovascular: RRR, nl S1-S2, no murmurs appreciated.   No LE edema.   Respiratory: Normal respiratory rate and rhythm.  CTAB without rales or wheezes. Abdominal: Abdomen soft and non-tender.  No distension or HSM.   Neuro/Psych: Strength symmetric in upper and lower extremities.  Judgment and insight appear normal.   The results of significant diagnostics from this hospitalization (including imaging, microbiology, ancillary and laboratory) are listed below for reference.     Microbiology: Recent Results (from the past 240 hour(s))   Respiratory Panel by RT PCR (Flu A&B, Covid) - Nasopharyngeal Swab     Status: None   Collection Time: 07/31/19 10:42 PM   Specimen: Nasopharyngeal Swab  Result Value Ref Range Status   SARS Coronavirus 2 by RT PCR NEGATIVE NEGATIVE Final    Comment: (NOTE) SARS-CoV-2 target nucleic acids are NOT DETECTED. The SARS-CoV-2 RNA is generally detectable in upper respiratoy specimens during the acute phase of infection. The lowest concentration of SARS-CoV-2 viral copies this assay can detect is 131 copies/mL. A negative result does not preclude SARS-Cov-2 infection and should not be used as the sole basis for treatment or other patient management decisions. A negative result may occur with  improper specimen collection/handling, submission of specimen other than nasopharyngeal swab, presence of viral mutation(s) within the areas targeted by this assay, and inadequate number of viral copies (<131 copies/mL). A negative result must be combined with clinical observations, patient history, and epidemiological information. The expected result is Negative. Fact Sheet for Patients:  https://www.moore.com/ Fact Sheet for Healthcare Providers:  https://www.young.biz/ This test is not yet ap proved or cleared  by the [place] and  has been authorized for detection and/or diagnosis of SARS-CoV-2 by FDA under an Emergency Use Authorization (EUA). This EUA will remain  in effect (meaning this test can be used) for the duration of the COVID-19 declaration under Section 564(b)(1) of the Act, 21 U.S.C. section 360bbb-3(b)(1), unless the authorization is terminated or revoked sooner.    Influenza A by PCR NEGATIVE NEGATIVE Final   Influenza B by PCR NEGATIVE NEGATIVE Final    Comment: (NOTE) The Xpert Xpress SARS-CoV-2/FLU/RSV assay is intended as an aid in  the diagnosis of influenza from Nasopharyngeal swab specimens and  should not be used as a sole  basis for treatment. Nasal washings and  aspirates are unacceptable for Xpert Xpress SARS-CoV-2/FLU/RSV  testing. Fact Sheet for Patients: https://www.moore.com/ Fact Sheet for Healthcare Providers: https://www.young.biz/ This test is not yet approved or cleared by the [place] FDA and  has been authorized for detection and/or diagnosis of SARS-CoV-2 by  FDA under an Emergency Use Authorization (EUA). This EUA will remain  in effect (meaning this test can be used) for the duration of the  Covid-19 declaration under Section 564(b)(1) of the Act, 21  U.S.C. section 360bbb-3(b)(1), unless the authorization is  terminated or revoked. Performed at [facility], [address] [place], [place] [address]   CSF culture with Stat gram stain     Status: None (Preliminary result)   Collection Time: 07/31/19 10:42 PM   Specimen: CSF; Cerebrospinal Fluid  Result Value Ref Range Status   Specimen Description   Final    CSF Performed at [facility], [address], [place], [place] [address]    Special Requests   Final    NONE Performed at [facility], [address] [place], [place] [address]    Gram Stain   Final    WBC PRESENT, PREDOMINANTLY MONONUCLEAR NO ORGANISMS SEEN CYTOSPIN SMEAR    Culture   Final    NO GROWTH 2 DAYS Performed at [facility], [address] [place], [place] [address]    Report Status PENDING  Incomplete     Labs: BNP (last 3 results) No results for input(s): BNP in the last 8760 hours. Basic Metabolic Panel: Recent Labs  Lab 07/31/19 2007 08/01/19 0345  NA 140 142  K 3.6 3.4*  CL 104 104  CO2 26 29  GLUCOSE 92 92  BUN 11 13  CREATININE 0.87 0.89  CALCIUM 9.5 9.7   Liver Function Tests: Recent Labs  Lab 07/31/19 2007 07/31/19 2242 08/01/19 0345  AST 19  --  19  ALT 10  --  10  ALKPHOS 58  --  57  BILITOT 0.8  --  1.0  PROT 7.9  --  7.9  ALBUMIN 4.6 4.7  4.5   No results for input(s): LIPASE, AMYLASE in the last 168 hours. No results for input(s): AMMONIA in the last 168 hours. CBC: Recent Labs  Lab 07/31/19 2007 08/01/19 0345  WBC 5.4 6.1  HGB 14.7 13.8  HCT 45.2 42.8  MCV 85.4 84.4  PLT 258 251   Cardiac Enzymes: Recent Labs  Lab 08/01/19 1710  CKTOTAL 131   BNP: Invalid input(s): POCBNP CBG: No results for input(s): GLUCAP in the last 168 hours. D-Dimer No results for input(s): DDIMER in the last 72 hours. Hgb A1c Recent Labs    08/01/19 0345  HGBA1C 5.7*   Lipid Profile No results for input(s): CHOL, HDL, LDLCALC, TRIG,  CHOLHDL, LDLDIRECT in the last 72 hours. Thyroid function studies Recent Labs    08/01/19 0345  TSH 1.992   Anemia work up Recent Labs    08/01/19 0345  VITAMINB12 286  FOLATE 7.8   Urinalysis    Component Value Date/Time   COLORURINE YELLOW (A) 08/01/2019 1224   APPEARANCEUR CLEAR (A) 08/01/2019 1224   LABSPEC 1.036 (H) 08/01/2019 1224   PHURINE 5.0 08/01/2019 1224   GLUCOSEU NEGATIVE 08/01/2019 1224   HGBUR NEGATIVE 08/01/2019 1224   BILIRUBINUR NEGATIVE 08/01/2019 1224   KETONESUR 20 (A) 08/01/2019 1224   PROTEINUR 30 (A) 08/01/2019 1224   NITRITE NEGATIVE 08/01/2019 1224   LEUKOCYTESUR NEGATIVE 08/01/2019 1224   Sepsis Labs Invalid input(s): PROCALCITONIN,  WBC,  LACTICIDVEN Microbiology Recent Results (from the past 240 hour(s))  Respiratory Panel by RT PCR (Flu A&B, Covid) - Nasopharyngeal Swab     Status: None   Collection Time: 07/31/19 10:42 PM   Specimen: Nasopharyngeal Swab  Result Value Ref Range Status   SARS Coronavirus 2 by RT PCR NEGATIVE NEGATIVE Final    Comment: (NOTE) SARS-CoV-2 target nucleic acids are NOT DETECTED. The SARS-CoV-2 RNA is generally detectable in upper respiratoy specimens during the acute phase of infection. The lowest concentration of SARS-CoV-2 viral copies this assay can detect is 131 copies/mL. A negative result does not preclude  SARS-Cov-2 infection and should not be used as the sole basis for treatment or other patient management decisions. A negative result may occur with  improper specimen collection/handling, submission of specimen other than nasopharyngeal swab, presence of viral mutation(s) within the areas targeted by this assay, and inadequate number of viral copies (<131 copies/mL). A negative result must be combined with clinical observations, patient history, and epidemiological information. The expected result is Negative. Fact Sheet for Patients:  https://www.moore.com/ Fact Sheet for Healthcare Providers:  https://www.young.biz/ This test is not yet ap proved or cleared by the [place] FDA and  has been authorized for detection and/or diagnosis of SARS-CoV-2 by FDA under an Emergency Use Authorization (EUA). This EUA will remain  in effect (meaning this test can be used) for the duration of the COVID-19 declaration under Section 564(b)(1) of the Act, 21 U.S.C. section 360bbb-3(b)(1), unless the authorization is terminated or revoked sooner.    Influenza A by PCR NEGATIVE NEGATIVE Final   Influenza B by PCR NEGATIVE NEGATIVE Final    Comment: (NOTE) The Xpert Xpress SARS-CoV-2/FLU/RSV assay is intended as an aid in  the diagnosis of influenza from Nasopharyngeal swab specimens and  should not be used as a sole basis for treatment. Nasal washings and  aspirates are unacceptable for Xpert Xpress SARS-CoV-2/FLU/RSV  testing. Fact Sheet for Patients: https://www.moore.com/ Fact Sheet for Healthcare Providers: https://www.young.biz/ This test is not yet approved or cleared by the [place] FDA and  has been authorized for detection and/or diagnosis of SARS-CoV-2 by  FDA under an Emergency Use Authorization (EUA). This EUA will remain  in effect (meaning this test can be used) for the duration of the  Covid-19  declaration under Section 564(b)(1) of the Act, 21  U.S.C. section 360bbb-3(b)(1), unless the authorization is  terminated or revoked. Performed at [facility], [address] [place], [place] [address]   CSF culture with Stat gram stain     Status: None (Preliminary result)   Collection Time: 07/31/19 10:42 PM   Specimen: CSF; Cerebrospinal Fluid  Result Value Ref Range Status   Specimen Description   Final  CSF Performed at [facility], [address] [place], [place] [address]    Special Requests   Final    NONE Performed at [facility] [address] [place], [place] [address]    Gram Stain   Final    WBC PRESENT, PREDOMINANTLY MONONUCLEAR NO ORGANISMS SEEN CYTOSPIN SMEAR    Culture   Final    NO GROWTH 2 DAYS Performed at [facility], [address], [place], [place] [address]    Report Status PENDING  Incomplete     Time coordinating discharge: 25 minutes      SIGNED:   Alberteen Sam, MD  [facility] 08/03/2019, 6:34 PM

## 2019-08-03 NOTE — Progress Notes (Signed)
Reason for consult: MS   Subjective:  Patient feels significantly better, almost close to baseline. Right leg strength has also improved.    ROS: negative except above  Examination  Vital signs in last 24 hours: Temp:  [97.8 F (36.6 C)-98.1 F (36.7 C)] 98.1 F (36.7 C) (01/15 0802) Pulse Rate:  [58-81] 58 (01/15 0802) Resp:  [20] 20 (01/15 0802) BP: (108-132)/(67-76) 132/67 (01/15 0802) SpO2:  [97 %-100 %] 100 % (01/15 0802) Weight:  [61.9 kg] 61.9 kg (01/15 0412)  General: lying in bed CVS: pulse-normal rate and rhythm RS: breathing comfortably Extremities: normal   Neuro: MS: Alert, oriented, follows commands CN: pupils equal and reactive,  EOMI, face symmetric, tongue midline, normal sensation over face, Motor: 5/5 strength in all 4 extremities Reflexes: 2+ bilaterally over patella, biceps, plantars: flexor Coordination: normal Gait: broad based gait on walking  Basic Metabolic Panel: Recent Labs  Lab 07/31/19 2007 08/01/19 0345  NA 140 142  K 3.6 3.4*  CL 104 104  CO2 26 29  GLUCOSE 92 92  BUN 11 13  CREATININE 0.87 0.89  CALCIUM 9.5 9.7    CBC: Recent Labs  Lab 07/31/19 2007 08/01/19 0345  WBC 5.4 6.1  HGB 14.7 13.8  HCT 45.2 42.8  MCV 85.4 84.4  PLT 258 251     Coagulation Studies: No results for input(s): LABPROT, INR in the last 72 hours.    ASSESSMENT AND PLAN  17 y male with numbness and difficulty walking with new diagnosis with MS.Has 2 previous clinical episodes of numbness,  MRI Brain, C and T spine consistent with MS with some enchancing lesions suggestive acute demyelination.  Plan 1)completed IV solumedrol x 3 days with significant  Improvement with only mild gait ataxia, patient requesting to be discharged. Can be discharged with oral prednisone 1250 mg x 2 days as he has not had significant hypertension with IV solumedrol 2)  IgG index /Oligoclonal bands pending 3) Will need follow up : recommend Dr. Epimenio Foot with [facility] as  soon as possible to start DMD, sent message on EPIC 4) Continue PT/OT   Spent 25 min counseling on diagnosis of MS and importance of starting DMD to prevent MS progression.     Georgiana Spinner Barry Faircloth [facility] Pager Number [phone] For questions after 7pm please refer to AMION to reach the Neurologist on call

## 2019-08-03 NOTE — Evaluation (Signed)
Physical Therapy Evaluation Patient Details Name: Aaron Owens MRN: [id number] DOB: Dec 31, 2000 Today's Date: 08/03/2019   History of Present Illness  Pt admitted for complaints of B LE weakness, now diagnosed with possible MS. Pt complains of weakness and trouble ambulating with numbness x 4 days. No further PMH noted.  Clinical Impression  Pt is a pleasant 19 year old male who was admitted for possible MS. Pt performs bed mobility/transfers with independence and ambulation with cga and occasional hallway rail. Decreased gait speed noted with ataxic gait. Pt demonstrates deficits with gait/mobility/balance. Pt very aware of environment and demonstrates good safety awareness. Reports he has significant assist at home. Would benefit from skilled PT to address above deficits and promote optimal return to PLOF. Currently recommending OP PT for further needs.    Follow Up Recommendations Outpatient PT(neuro PT clinic in [place])    Equipment Recommendations  None recommended by PT    Recommendations for Other Services       Precautions / Restrictions Precautions Precautions: Fall Restrictions Weight Bearing Restrictions: No      Mobility  Bed Mobility Overal bed mobility: Independent             General bed mobility comments: safe technique with easy ability to sit at EOB  Transfers Overall transfer level: Independent Equipment used: None             General transfer comment: safe technique with upright posture noted.  Ambulation/Gait Ambulation/Gait assistance: Min guard Gait Distance (Feet): 150 Feet Assistive device: None Gait Pattern/deviations: Step-through pattern     General Gait Details: slightly ataxic gait pattern with increased supination on L foot and occasional toe drag. Reaches for hallway railing at time for steadiness. Decreased arm swing noted  Stairs Stairs: Yes Stairs assistance: Min guard Stair Management: One rail Right;Alternating  pattern Number of Stairs: 6 General stair comments: navigated stairs with alternating stepping and hand rail. At times, doesn't place foot fully on step, needs cues for foot placement.  Wheelchair Mobility    Modified Rankin (Stroke Patients Only)       Balance Overall balance assessment: Mild deficits observed, not formally tested                                           Pertinent Vitals/Pain Pain Assessment: No/denies pain    Home Living Family/patient expects to be discharged to:: Private residence Living Arrangements: Other relatives Available Help at Discharge: Family;Available 24 hours/day Type of Home: House Home Access: Stairs to enter Entrance Stairs-Rails: Can reach both Entrance Stairs-Number of Steps: 3 Home Layout: One level Home Equipment: Crutches      Prior Function Level of Independence: Independent         Comments: very active, indep.     Hand Dominance        Extremity/Trunk Assessment   Upper Extremity Assessment Upper Extremity Assessment: Overall WFL for tasks assessed    Lower Extremity Assessment Lower Extremity Assessment: Generalized weakness(hip flexion 4/5; otherwise 5/5)       Communication   Communication: No difficulties  Cognition Arousal/Alertness: Awake/alert Behavior During Therapy: WFL for tasks assessed/performed Overall Cognitive Status: Within Functional Limits for tasks assessed  General Comments      Exercises     Assessment/Plan    PT Assessment Patient needs continued PT services  PT Problem List Decreased strength;Decreased balance;Decreased activity tolerance;Decreased coordination       PT Treatment Interventions Gait training;Therapeutic exercise;Balance training;Therapeutic activities;Patient/family education    PT Goals (Current goals can be found in the Care Plan section)  Acute Rehab PT Goals Patient Stated Goal:  hopeful for dc today PT Goal Formulation: With patient Time For Goal Achievement: 08/17/19 Potential to Achieve Goals: Good    Frequency Min 2X/week   Barriers to discharge        Co-evaluation               AM-PAC PT "6 Clicks" Mobility  Outcome Measure Help needed turning from your back to your side while in a flat bed without using bedrails?: None Help needed moving from lying on your back to sitting on the side of a flat bed without using bedrails?: None Help needed moving to and from a bed to a chair (including a wheelchair)?: None Help needed standing up from a chair using your arms (e.g., wheelchair or bedside chair)?: None Help needed to walk in hospital room?: None Help needed climbing 3-5 steps with a railing? : A Little 6 Click Score: 23    End of Session Equipment Utilized During Treatment: Gait belt Activity Tolerance: Patient tolerated treatment well Patient left: in bed Nurse Communication: Mobility status PT Visit Diagnosis: Unsteadiness on feet (R26.81);Muscle weakness (generalized) (M62.81);Ataxic gait (R26.0);Difficulty in walking, not elsewhere classified (R26.2)    Time: 7867-5449 PT Time Calculation (min) (ACUTE ONLY): 33 min   Charges:   PT Evaluation $PT Eval Low Complexity: 1 Low PT Treatments $Gait Training: 8-22 mins        Greggory Stallion, PT, DPT [phone]   Aaron Owens 08/03/2019, 4:03 PM

## 2019-08-04 LAB — CSF CULTURE W GRAM STAIN: Culture: NO GROWTH

## 2019-08-04 LAB — VITAMIN B1: Vitamin B1 (Thiamine): 102 nmol/L (ref 66.5–200.0)

## 2019-08-06 ENCOUNTER — Telehealth: Payer: Self-pay | Admitting: *Deleted

## 2019-08-06 NOTE — Telephone Encounter (Signed)
Called and spoke with pt. Scheduled appt with Dr. Epimenio Foot for 08/07/19 at 4pm. Advised him to check in at 330pm, wear mask, bring insurance cards and updated med list. He verbalized understanding. He verified he has mychart and will look on here for appt info as well.

## 2019-08-07 ENCOUNTER — Ambulatory Visit: Payer: Medicaid Other | Admitting: Neurology

## 2019-08-07 ENCOUNTER — Other Ambulatory Visit: Payer: Self-pay

## 2019-08-07 ENCOUNTER — Encounter: Payer: Self-pay | Admitting: Neurology

## 2019-08-07 ENCOUNTER — Telehealth: Payer: Self-pay | Admitting: *Deleted

## 2019-08-07 VITALS — BP 119/57 | HR 82 | Temp 97.4°F | Ht 67.0 in | Wt 141.2 lb

## 2019-08-07 DIAGNOSIS — R200 Anesthesia of skin: Secondary | ICD-10-CM

## 2019-08-07 DIAGNOSIS — Z79899 Other long term (current) drug therapy: Secondary | ICD-10-CM

## 2019-08-07 DIAGNOSIS — R202 Paresthesia of skin: Secondary | ICD-10-CM | POA: Diagnosis not present

## 2019-08-07 DIAGNOSIS — G35 Multiple sclerosis: Secondary | ICD-10-CM | POA: Insufficient documentation

## 2019-08-07 DIAGNOSIS — R270 Ataxia, unspecified: Secondary | ICD-10-CM | POA: Diagnosis not present

## 2019-08-07 DIAGNOSIS — Z8669 Personal history of other diseases of the nervous system and sense organs: Secondary | ICD-10-CM | POA: Diagnosis not present

## 2019-08-07 NOTE — Telephone Encounter (Signed)
Placed [facility] lab in [facility] lock box for routine lab pick up. Results pending. 

## 2019-08-07 NOTE — Progress Notes (Addendum)
[facility]  PATIENT: Aaron Owens DOB: 03-10-2001  REFERRING DOCTOR OR PCP:  Dr. Eudelia Bunch SOURCE: Patient, notes from the emergency room, imaging and lab reports, MRI images personally reviewed.  _________________________________   HISTORICAL  CHIEF COMPLAINT:  Chief Complaint  Patient presents with  . New Patient (Initial Visit)    RM 13. ER referral for MS.  Great grandmother on mothers side had MS.  Feels fine, no sx currently. He received IV steroids in hospital.     HISTORY OF PRESENT ILLNESS:  I had the pleasure of seeing patient, Aaron Owens, at the [place] center at [facility] for neurologic consultation regarding his recent diagnosis of relapsing remitting multiple sclerosis.  He is a 19 year old man who had the onset of groin numbness followed by leg numbness and poor gait/balance on 07/29/2019.  The numbness felt like he had been sitting on the toilet for too long.  When symptoms persisted, he went to the emergency room on 07/31/2019.     He did not have any weakness or visual symptoms.   He noted no change in bladder function.   In the emergency room, he had a lumbar spine MRI which was read as normal.  This was followed by a lumbar puncture and MRI of the spine.  The lumbar puncture showed a mild pleocytosis.  The MRI of the brain and spinal cord showed multiple foci consistent with MS.  For foci in the spinal cord and multiple foci in the brain enhanced after contrast.  He was admitted and received IV Solumedrol x 3 days.   Due to rapid improvement, he was discharged after 3 days of IV steroids and received 2 more days of high-dose oral prednisone (1250 mg).  He feels much better with balance almost baseline and numbness close to baseline.    In 2018, he had similar but milder numbness in the groin x 2 days.  At that time, he had no trouble with gait.  Symptoms improved over 1 to 2 weeks.  In early 2020, he had diplopia for 5-6 days.   He  set up an ophthalmology appointment but was back to baseline before the visit so he canceled the visit.   He feels vision on the left may be a little worse than the right but colors are symmetric.    He is otherwise healthy.  His maternal great grandmother had MS.    Data: MRI brain 07/31/2019: There are multiple T2/FLAIR hyperintense foci in the hemispheres and also in the brainstem.  One focus in the right pons enhances after contrast.  Several 8 foci in the hemispheres also enhance after contrast.  Other foci did not enhance.  MRI cervical spine 08/01/2019 shows several T2 hyperintense foci within the spinal cord.  One adjacent to C4 posteriorly enhances after contrast.  MRI of the thoracic spine 08/01/2019 shows foci adjacent to T10, T11 and from T12 to the conus medullaris.  All of these enhance.  MRI lumbar spine 07/31/2019 showed no degenerative changes.  Lumbar puncture 07/31/2019 showed oligoclonal bands and elevated IgG index.  9 white blood cells per cubic millimeter were seen (lymphocytes).  14 glucose was normal.  Laboratory tests 07/31/2019: HIV, TSH, CBC with differential, B12, thiamine, ANA, Lyme, ESR, CRP were normal or negative.   Urine tox screen showed cannabinoid.     REVIEW OF SYSTEMS: Constitutional: No fevers, chills, sweats, or change in appetite Eyes: No visual changes, double vision, eye pain Ear, nose and throat: No  hearing loss, ear pain, nasal congestion, sore throat Cardiovascular: No chest pain, palpitations Respiratory: No shortness of breath at rest or with exertion.   No wheezes GastrointestinaI: No nausea, vomiting, diarrhea, abdominal pain, fecal incontinence Genitourinary: No dysuria, urinary retention or frequency.  No nocturia. Musculoskeletal: No neck pain, back pain Integumentary: No rash, pruritus, skin lesions Neurological: as above Psychiatric: No depression at this time.  No anxiety Endocrine: No palpitations, diaphoresis, change in appetite,  change in weigh or increased thirst Hematologic/Lymphatic: No anemia, purpura, petechiae. Allergic/Immunologic: No itchy/runny eyes, nasal congestion, recent allergic reactions, rashes  ALLERGIES: No Known Allergies  HOME MEDICATIONS: No current outpatient medications on file.  PAST MEDICAL HISTORY: Past Medical History:  Diagnosis Date  . Multiple sclerosis ([place])     PAST SURGICAL HISTORY: Past Surgical History:  Procedure Laterality Date  . NO PAST SURGERIES      FAMILY HISTORY: History reviewed. No pertinent family history.  SOCIAL HISTORY:  Social History   Socioeconomic History  . Marital status: Single    Spouse name: Not on file  . Number of children: 0  . Years of education: 3  . Highest education level: Not on file  Occupational History  . Not on file  Tobacco Use  . Smoking status: Never Smoker  . Smokeless tobacco: Never Used  Substance and Sexual Activity  . Alcohol use: Not Currently  . Drug use: Not Currently  . Sexual activity: Not on file  Other Topics Concern  . Not on file  Social History Narrative   Right handed   Caffeine: none   Lives with family   Social Determinants of Health   Financial Resource Strain:   . Difficulty of Paying Living Expenses: Not on file  Food Insecurity:   . Worried About Charity fundraiser in the Last Year: Not on file  . Ran Out of Food in the Last Year: Not on file  Transportation Needs:   . Lack of Transportation (Medical): Not on file  . Lack of Transportation (Non-Medical): Not on file  Physical Activity:   . Days of Exercise per Week: Not on file  . Minutes of Exercise per Session: Not on file  Stress:   . Feeling of Stress : Not on file  Social Connections:   . Frequency of Communication with Friends and Family: Not on file  . Frequency of Social Gatherings with Friends and Family: Not on file  . Attends Religious Services: Not on file  . Active Member of Clubs or Organizations: Not on file    . Attends Archivist Meetings: Not on file  . Marital Status: Not on file  Intimate Partner Violence:   . Fear of Current or Ex-Partner: Not on file  . Emotionally Abused: Not on file  . Physically Abused: Not on file  . Sexually Abused: Not on file     PHYSICAL EXAM  Vitals:   08/07/19 1553  BP: (!) 119/57  Pulse: 82  Temp: (!) 97.4 F (36.3 C)  Weight: 141 lb 3.2 oz (64 kg)  Height: '5\' 7"'$  (1.702 m)    Body mass index is 22.12 kg/m.   General: The patient is well-developed and well-nourished and in no acute distress  HEENT:  Head is [place]/AT.  Sclera are anicteric.  Funduscopic exam shows normal optic discs and retinal vessels.  Neck: No carotid bruits are noted.  The neck is nontender.  Cardiovascular: The heart has a regular rate and rhythm with a normal S1  and S2. There were no murmurs, gallops or rubs.    Skin: Extremities are without rash or  edema.  Musculoskeletal:  Back is nontender  Neurologic Exam  Mental status: The patient is alert and oriented x 3 at the time of the examination. The patient has apparent normal recent and remote memory, with an apparently normal attention span and concentration ability.   Speech is normal.  Cranial nerves: Extraocular movements are full. Pupils are equal, round, and reactive to light and accomodation.,  Vision was symmetric though he felt visual acuity was better on the left than the right.. There is good facial sensation to soft touch bilaterally.Facial strength is normal.  Trapezius and sternocleidomastoid strength is normal. No dysarthria is noted.  The tongue is midline, and the patient has symmetric elevation of the soft palate. No obvious hearing deficits are noted.  Motor:  Muscle bulk is normal.   Tone is normal. Strength is  5 / 5 in all 4 extremities.   Sensory: Sensory testing is intact to pinprick, soft touch and vibration sensation in the arms but he has reduced touch sensation in the legs, more on the  right.  Coordination: Cerebellar testing reveals good finger-nose-finger and heel-to-shin bilaterally.  Gait and station: Station is normal.   Gait is slightly wide and tandem gait is wide.. Romberg is negative.   Reflexes: Deep tendon reflexes are symmetric and normal bilaterally.   Plantar responses are flexor.    DIAGNOSTIC DATA (LABS, IMAGING, TESTING) - I reviewed patient records, labs, notes, testing and imaging myself where available.  Lab Results  Component Value Date   WBC 6.1 08/01/2019   HGB 13.8 08/01/2019   HCT 42.8 08/01/2019   MCV 84.4 08/01/2019   PLT 251 08/01/2019      Component Value Date/Time   NA 142 08/01/2019 0345   K 3.4 (L) 08/01/2019 0345   CL 104 08/01/2019 0345   CO2 29 08/01/2019 0345   GLUCOSE 92 08/01/2019 0345   BUN 13 08/01/2019 0345   CREATININE 0.89 08/01/2019 0345   CALCIUM 9.7 08/01/2019 0345   PROT 7.9 08/01/2019 0345   ALBUMIN 4.5 08/01/2019 0345   ALBUMIN 4.7 07/31/2019 2242   AST 19 08/01/2019 0345   ALT 10 08/01/2019 0345   ALKPHOS 57 08/01/2019 0345   BILITOT 1.0 08/01/2019 0345   GFRNONAA >60 08/01/2019 0345   GFRAA >60 08/01/2019 0345   No results found for: CHOL, HDL, LDLCALC, LDLDIRECT, TRIG, CHOLHDL Lab Results  Component Value Date   HGBA1C 5.7 (H) 08/01/2019   Lab Results  Component Value Date   VITAMINB12 286 08/01/2019   Lab Results  Component Value Date   TSH 1.992 08/01/2019       ASSESSMENT AND PLAN  Multiple sclerosis ([place]) - Plan: QuantiFERON-TB Gold Plus, Hepatitis B core antibody, total, Hepatitis B surface antigen, Stratify JCV Antibody Test ([facility]), Hep B Surface Antibody, Varicella zoster antibody, IgG  High risk medication use - Plan: QuantiFERON-TB Gold Plus, Hepatitis B core antibody, total, Hepatitis B surface antigen, Stratify JCV Antibody Test ([facility]), Hep B Surface Antibody, Varicella zoster antibody, IgG  Numbness and tingling of both legs  Ataxia  History of diplopia   In  summary, Mr. Badal is a 19 year old man with recent diagnosis of relapsing remitting multiple sclerosis.  He is presenting very aggressively with 4 enhancing lesions in the spinal cord, one in the cervical  and 3 in the lower thoracic spinal cord.  Additionally, he has multiple enhancing lesions  in the brain including 1 in the pons.  He has other nonenhancing lesions in the supratentorial and infratentorial white matter.  I showed him the MRI images.  We discussed that due to the aggressive presentation I feel he would ultimately do best if he is started on a highly effective disease modifying therapy as soon as possible.  I recommended that he begin either Tysabri or Ocrevus.  I will check blood work for both of these to rule out chronic infections and to assess the JCV antibody status and had him sign both service request forms.  Based on the results of the studies we will get him started on 1 of these 2 medications.  He is doing much better and is advised to continue to stay active and finish up his physical therapy.  He will return to see me in 3 months or sooner if there are new or worsening neurologic symptoms.   Arnel Wymer A. Felecia Shelling, MD, [facility] 08/27/1066, 1:66 PM Certified in Neurology, Clinical Neurophysiology, Sleep Medicine and Neuroimaging  [facility] [address] [place], [place] [address] [phone]

## 2019-08-09 LAB — LYME DISEASE, WESTERN BLOT
IgG P18 Ab.: ABSENT
IgG P23 Ab.: ABSENT
IgG P28 Ab.: ABSENT
IgG P30 Ab.: ABSENT
IgG P39 Ab.: ABSENT
IgG P45 Ab.: ABSENT
IgG P58 Ab.: ABSENT
IgG P66 Ab.: ABSENT
IgG P93 Ab.: ABSENT
IgM P23 Ab.: ABSENT
IgM P39 Ab.: ABSENT
IgM P41 Ab.: ABSENT
Lyme IgG Wb: NEGATIVE
Lyme IgM Wb: NEGATIVE

## 2019-08-10 LAB — HEPATITIS B SURFACE ANTIGEN: Hepatitis B Surface Ag: NEGATIVE

## 2019-08-10 LAB — QUANTIFERON-TB GOLD PLUS
QuantiFERON Mitogen Value: >10 IU/mL
QuantiFERON Nil Value: 0.05 IU/mL
QuantiFERON TB1 Ag Value: 0.05 IU/mL
QuantiFERON TB2 Ag Value: 0.05 IU/mL
QuantiFERON-TB Gold Plus: NEGATIVE

## 2019-08-10 LAB — VARICELLA ZOSTER ANTIBODY, IGG: Varicella zoster IgG: 290 index (ref >165)

## 2019-08-10 LAB — HEPATITIS B SURFACE ANTIBODY,QUALITATIVE: Hep B Surface Ab, Qual: REACTIVE

## 2019-08-10 LAB — HEPATITIS B CORE ANTIBODY, TOTAL: Hep B Core Total Ab: NEGATIVE

## 2019-08-14 NOTE — Telephone Encounter (Signed)
Called [facility] to check on status of results. Test results are still pending.

## 2019-08-15 NOTE — Telephone Encounter (Signed)
I called pt. Advised JCV ab positive, Tysabri not an option for treatment for MS.  We will try to get him initiated on Ocrevus. He did not have insurance cards on file. He uploaded to [employer] for me. Advised we will need to get Ocrevus approved via insurance first. We will then send orders to [facility] to get scheduled once it is approved. He verbalized understanding.

## 2019-08-15 NOTE — Telephone Encounter (Signed)
Submitted urgent PA Ocrevus by fax to [facility] at [phone]. Included OV notes. Received fax confirmation. Waiting on determination.

## 2019-08-15 NOTE — Telephone Encounter (Signed)
JCV ab drawn on 08/07/19 positive, index: 3.60

## 2019-08-16 NOTE — Telephone Encounter (Signed)
Tried checking status of PA on [employer] website but nothing is showing up. I called and they have not received yet. I re-faxed and received fax confirmation. Ref # for call: [id number]. Waiting on determination.

## 2019-08-20 NOTE — Telephone Encounter (Signed)
Checked on status of PA on [email or web address]. PA approved 08/16/19-08/10/20. Prior Approval [id number].   Faxed completed/signed Ocrevus start form to [employer] at [phone]. Received fax confirmation. Faxed signed orders to [facility] care center at [phone]. Received fax confirmation. Asked that they call pt to schedule.

## 2019-08-21 NOTE — Telephone Encounter (Signed)
Pt scheduled with [facility] care center 08/24/19 at 8am for Ocrevus

## 2019-08-24 ENCOUNTER — Ambulatory Visit (HOSPITAL_COMMUNITY)
Admission: RE | Admit: 2019-08-24 | Discharge: 2019-08-24 | Disposition: A | Discharge status: Home / Self Care | Payer: Medicaid Other | Source: Ambulatory Visit | Attending: Internal Medicine | Admitting: Internal Medicine

## 2019-08-24 ENCOUNTER — Other Ambulatory Visit: Payer: Self-pay

## 2019-08-24 DIAGNOSIS — G35 Multiple sclerosis: Secondary | ICD-10-CM | POA: Insufficient documentation

## 2019-08-24 MED ORDER — ACETAMINOPHEN 325 MG PO TABS
650.0000 mg | ORAL_TABLET | Freq: Once | ORAL | Status: AC
  Administered 2019-08-24: 650 mg via ORAL
  Filled 2019-08-24: qty 2

## 2019-08-24 MED ORDER — DIPHENHYDRAMINE HCL 50 MG/ML IJ SOLN
50.0000 mg | Freq: Once | INTRAMUSCULAR | Status: AC
  Administered 2019-08-24: 50 mg via INTRAVENOUS
  Filled 2019-08-24: qty 1

## 2019-08-24 MED ORDER — FAMOTIDINE IN NACL 20-0.9 MG/50ML-% IV SOLN
20.0000 mg | Freq: Once | INTRAVENOUS | Status: AC
  Administered 2019-08-24: 09:00:00 20 mg via INTRAVENOUS
  Filled 2019-08-24: qty 50

## 2019-08-24 MED ORDER — SODIUM CHLORIDE 0.9 % IV SOLN
300.0000 mg | Freq: Once | INTRAVENOUS | Status: AC
  Administered 2019-08-24: 300 mg via INTRAVENOUS
  Filled 2019-08-24: qty 10

## 2019-08-24 MED ORDER — SODIUM CHLORIDE 0.9 % IV SOLN
INTRAVENOUS | Status: DC | PRN
  Administered 2019-08-24: 250 mL via INTRAVENOUS

## 2019-08-24 MED ORDER — METHYLPREDNISOLONE SODIUM SUCC 125 MG IJ SOLR
100.0000 mg | Freq: Once | INTRAMUSCULAR | Status: AC
  Administered 2019-08-24: 09:00:00 100 mg via INTRAVENOUS
  Filled 2019-08-24: qty 2

## 2019-08-24 NOTE — Progress Notes (Signed)
PATIENT CARE CENTER NOTE  Diagnosis: Multiple Sclerosis   Provider: Despina Arias, MD   Procedure: Ocrevus 300 mg IV   Note: Patient received Ocrevus 300 mg infusion via PIV. Pre-medications given (Tylenol, Solumedrol, Benadryl, Famotidine) per order. Patient tolerated infusion well with no adverse reaction. Vital signs stable. Patient observed for 1 hour post-infusion. Discharge instructions given. Patient to come back in 15 days for second 300 mg infusion. Alert, oriented and ambulatory at discharge.

## 2019-08-24 NOTE — Discharge Instructions (Signed)
Ocrelizumab injection °What is this medicine? °OCRELIZUMAB (ok re LIZ ue mab) treats multiple sclerosis. It helps to decrease the number of multiple sclerosis relapses. It is not a cure. °This medicine may be used for other purposes; ask your health care provider or pharmacist if you have questions. °COMMON BRAND NAME(S): OCREVUS °What should I tell my health care provider before I take this medicine? °They need to know if you have any of these conditions: °· cancer °· hepatitis B infection °· other infection (especially a virus infection such as chickenpox, cold sores, or herpes) °· an unusual or allergic reaction to ocrelizumab, other medicines, foods, dyes or preservatives °· pregnant or trying to get pregnant °· breast-feeding °How should I use this medicine? °This medicine is for infusion into a vein. It is given by a health care professional in a hospital or clinic setting. °A special MedGuide will be given to you before each treatment. Be sure to read this information carefully each time. °Talk to your pediatrician regarding the use of this medicine in children. Special care may be needed. °Overdosage: If you think you have taken too much of this medicine contact a poison control center or emergency room at once. °NOTE: This medicine is only for you. Do not share this medicine with others. °What if I miss a dose? °Keep appointments for follow-up doses as directed. It is important not to miss your dose. Call your doctor or health care professional if you are unable to keep an appointment. °What may interact with this medicine? °· alemtuzumab °· daclizumab °· dimethyl fumarate °· fingolimod °· glatiramer °· interferon beta °· live virus vaccines °· mitoxantrone °· natalizumab °· peginterferon beta °· rituximab °· steroid medicines like prednisone or cortisone °· teriflunomide °This list may not describe all possible interactions. Give your health care provider a list of all the medicines, herbs,  non-prescription drugs, or dietary supplements you use. Also tell them if you smoke, drink alcohol, or use illegal drugs. Some items may interact with your medicine. °What should I watch for while using this medicine? °Tell your doctor or healthcare professional if your symptoms do not start to get better or if they get worse. °This medicine can cause serious allergic reactions. To reduce your risk you may need to take medicine before treatment with this medicine. Take your medicine as directed. °Women should inform their doctor if they wish to become pregnant or think they might be pregnant. There is a potential for serious side effects to an unborn child. Talk to your health care professional or pharmacist for more information. Male patients should use effective birth control methods while receiving this medicine and for 6 months after the last dose. °Call your doctor or health care professional for advice if you get a fever, chills or sore throat, or other symptoms of a cold or flu. Do not treat yourself. This drug decreases your body's ability to fight infections. Try to avoid being around people who are sick. °If you have a hepatitis B infection or a history of a hepatitis B infection, talk to your doctor. The symptoms of hepatitis B may get worse if you take this medicine. °In some patients, this medicine may cause a serious brain infection that may cause death. If you have any problems seeing, thinking, speaking, walking, or standing, tell your doctor right away. If you cannot reach your doctor, urgently seek other source of medical care. °This medicine can decrease the response to a vaccine. If you need to get   vaccinated, tell your healthcare professional if you have received this medicine. Extra booster doses may be needed. Talk to your doctor to see if a different vaccination schedule is needed. °Talk to your doctor about your risk of cancer. You may be more at risk for certain types of cancers if you  take this medicine. °What side effects may I notice from receiving this medicine? °Side effects that you should report to your doctor or health care professional as soon as possible: °· allergic reactions like skin rash, itching or hives, swelling of the face, lips, or tongue °· breathing problems °· facial flushing °· fast, irregular heartbeat °· lump or soreness in the breast °· signs and symptoms of herpes such as cold sore, shingles, or genital sores °· signs and symptoms of infection like fever or chills, cough, sore throat, pain or trouble passing urine °· signs and symptoms of low blood pressure like dizziness; feeling faint or lightheaded, falls; unusually weak or tired °· signs and symptoms of progressive multifocal leukoencephalopathy (PML) like changes in vision; clumsiness; confusion; personality changes; weakness on one side of the body °· swelling of the ankles, feet, hands °Side effects that usually do not require medical attention (report these to your doctor or health care professional if they continue or are bothersome): °· back pain °· depressed mood °· diarrhea °· pain, redness, or irritation at site where injected °This list may not describe all possible side effects. Call your doctor for medical advice about side effects. You may report side effects to FDA at 1-800-FDA-1088. °Where should I keep my medicine? °This drug is given in a hospital or clinic and will not be stored at home. °NOTE: This sheet is a summary. It may not cover all possible information. If you have questions about this medicine, talk to your doctor, pharmacist, or health care provider. °© 2020 Elsevier/Gold Standard (2018-07-10 07:41:53) ° °

## 2019-09-10 ENCOUNTER — Ambulatory Visit (HOSPITAL_COMMUNITY)
Admission: RE | Admit: 2019-09-10 | Discharge: 2019-09-10 | Disposition: A | Discharge status: Home / Self Care | Payer: Medicaid Other | Source: Ambulatory Visit | Attending: Internal Medicine | Admitting: Internal Medicine

## 2019-09-10 ENCOUNTER — Other Ambulatory Visit: Payer: Self-pay

## 2019-09-10 DIAGNOSIS — G35 Multiple sclerosis: Secondary | ICD-10-CM | POA: Diagnosis not present

## 2019-09-10 MED ORDER — ACETAMINOPHEN 325 MG PO TABS
650.0000 mg | ORAL_TABLET | Freq: Once | ORAL | Status: AC
  Administered 2019-09-10: 650 mg via ORAL
  Filled 2019-09-10: qty 2

## 2019-09-10 MED ORDER — METHYLPREDNISOLONE SODIUM SUCC 125 MG IJ SOLR
125.0000 mg | Freq: Once | INTRAMUSCULAR | Status: AC
  Administered 2019-09-10: 08:00:00 125 mg via INTRAVENOUS
  Filled 2019-09-10: qty 2

## 2019-09-10 MED ORDER — SODIUM CHLORIDE 0.9 % IV SOLN
300.0000 mg | Freq: Once | INTRAVENOUS | Status: AC
  Administered 2019-09-10: 09:00:00 300 mg via INTRAVENOUS
  Filled 2019-09-10: qty 10

## 2019-09-10 MED ORDER — SODIUM CHLORIDE 0.9 % IV SOLN
INTRAVENOUS | Status: DC | PRN
  Administered 2019-09-10: 08:00:00 250 mL via INTRAVENOUS

## 2019-09-10 MED ORDER — FAMOTIDINE IN NACL 20-0.9 MG/50ML-% IV SOLN
20.0000 mg | Freq: Once | INTRAVENOUS | Status: AC
  Administered 2019-09-10: 20 mg via INTRAVENOUS
  Filled 2019-09-10: qty 50

## 2019-09-10 MED ORDER — DIPHENHYDRAMINE HCL 50 MG/ML IJ SOLN
50.0000 mg | Freq: Once | INTRAMUSCULAR | Status: AC
  Administered 2019-09-10: 50 mg via INTRAVENOUS
  Filled 2019-09-10: qty 1

## 2019-09-10 NOTE — Discharge Instructions (Signed)
Ocrelizumab injection °What is this medicine? °OCRELIZUMAB (ok re LIZ ue mab) treats multiple sclerosis. It helps to decrease the number of multiple sclerosis relapses. It is not a cure. °This medicine may be used for other purposes; ask your health care provider or pharmacist if you have questions. °COMMON BRAND NAME(S): OCREVUS °What should I tell my health care provider before I take this medicine? °They need to know if you have any of these conditions: °· cancer °· hepatitis B infection °· other infection (especially a virus infection such as chickenpox, cold sores, or herpes) °· an unusual or allergic reaction to ocrelizumab, other medicines, foods, dyes or preservatives °· pregnant or trying to get pregnant °· breast-feeding °How should I use this medicine? °This medicine is for infusion into a vein. It is given by a health care professional in a hospital or clinic setting. °A special MedGuide will be given to you before each treatment. Be sure to read this information carefully each time. °Talk to your pediatrician regarding the use of this medicine in children. Special care may be needed. °Overdosage: If you think you have taken too much of this medicine contact a poison control center or emergency room at once. °NOTE: This medicine is only for you. Do not share this medicine with others. °What if I miss a dose? °Keep appointments for follow-up doses as directed. It is important not to miss your dose. Call your doctor or health care professional if you are unable to keep an appointment. °What may interact with this medicine? °· alemtuzumab °· daclizumab °· dimethyl fumarate °· fingolimod °· glatiramer °· interferon beta °· live virus vaccines °· mitoxantrone °· natalizumab °· peginterferon beta °· rituximab °· steroid medicines like prednisone or cortisone °· teriflunomide °This list may not describe all possible interactions. Give your health care provider a list of all the medicines, herbs,  non-prescription drugs, or dietary supplements you use. Also tell them if you smoke, drink alcohol, or use illegal drugs. Some items may interact with your medicine. °What should I watch for while using this medicine? °Tell your doctor or healthcare professional if your symptoms do not start to get better or if they get worse. °This medicine can cause serious allergic reactions. To reduce your risk you may need to take medicine before treatment with this medicine. Take your medicine as directed. °Women should inform their doctor if they wish to become pregnant or think they might be pregnant. There is a potential for serious side effects to an unborn child. Talk to your health care professional or pharmacist for more information. Male patients should use effective birth control methods while receiving this medicine and for 6 months after the last dose. °Call your doctor or health care professional for advice if you get a fever, chills or sore throat, or other symptoms of a cold or flu. Do not treat yourself. This drug decreases your body's ability to fight infections. Try to avoid being around people who are sick. °If you have a hepatitis B infection or a history of a hepatitis B infection, talk to your doctor. The symptoms of hepatitis B may get worse if you take this medicine. °In some patients, this medicine may cause a serious brain infection that may cause death. If you have any problems seeing, thinking, speaking, walking, or standing, tell your doctor right away. If you cannot reach your doctor, urgently seek other source of medical care. °This medicine can decrease the response to a vaccine. If you need to get   vaccinated, tell your healthcare professional if you have received this medicine. Extra booster doses may be needed. Talk to your doctor to see if a different vaccination schedule is needed. °Talk to your doctor about your risk of cancer. You may be more at risk for certain types of cancers if you  take this medicine. °What side effects may I notice from receiving this medicine? °Side effects that you should report to your doctor or health care professional as soon as possible: °· allergic reactions like skin rash, itching or hives, swelling of the face, lips, or tongue °· breathing problems °· facial flushing °· fast, irregular heartbeat °· lump or soreness in the breast °· signs and symptoms of herpes such as cold sore, shingles, or genital sores °· signs and symptoms of infection like fever or chills, cough, sore throat, pain or trouble passing urine °· signs and symptoms of low blood pressure like dizziness; feeling faint or lightheaded, falls; unusually weak or tired °· signs and symptoms of progressive multifocal leukoencephalopathy (PML) like changes in vision; clumsiness; confusion; personality changes; weakness on one side of the body °· swelling of the ankles, feet, hands °Side effects that usually do not require medical attention (report these to your doctor or health care professional if they continue or are bothersome): °· back pain °· depressed mood °· diarrhea °· pain, redness, or irritation at site where injected °This list may not describe all possible side effects. Call your doctor for medical advice about side effects. You may report side effects to FDA at 1-800-FDA-1088. °Where should I keep my medicine? °This drug is given in a hospital or clinic and will not be stored at home. °NOTE: This sheet is a summary. It may not cover all possible information. If you have questions about this medicine, talk to your doctor, pharmacist, or health care provider. °© 2020 Elsevier/Gold Standard (2018-07-10 07:41:53) ° °

## 2019-09-10 NOTE — Progress Notes (Signed)
PATIENT CARE CENTER NOTE  Diagnosis: Multiple Sclerosis   Provider: Despina Arias, MD   Procedure: Ocrevus 300 mg IV   Note: Patient received second Ocrevus 300 mg infusion via PIV. Pre-medications given (Tylenol, Solumedrol, Benadryl, Famotidine) per order. Patient tolerated infusion well with no adverse reaction. Vital signs stable. Patient observed for 1 hour post-infusion. Discharge instructions given. Patient is to come back every 6 months for 600 mg infusion. Alert, oriented and ambulatory at discharge.

## 2019-11-01 ENCOUNTER — Ambulatory Visit: Payer: Medicaid Other | Admitting: Neurology

## 2019-11-01 ENCOUNTER — Telehealth: Payer: Self-pay | Admitting: Neurology

## 2019-11-01 ENCOUNTER — Other Ambulatory Visit: Payer: Self-pay

## 2019-11-01 ENCOUNTER — Encounter: Payer: Self-pay | Admitting: Neurology

## 2019-11-01 VITALS — BP 110/78 | HR 57 | Temp 97.2°F | Ht 67.0 in | Wt 142.0 lb

## 2019-11-01 DIAGNOSIS — Z8669 Personal history of other diseases of the nervous system and sense organs: Secondary | ICD-10-CM

## 2019-11-01 DIAGNOSIS — G35 Multiple sclerosis: Secondary | ICD-10-CM

## 2019-11-01 DIAGNOSIS — R270 Ataxia, unspecified: Secondary | ICD-10-CM

## 2019-11-01 DIAGNOSIS — R200 Anesthesia of skin: Secondary | ICD-10-CM

## 2019-11-01 DIAGNOSIS — Z79899 Other long term (current) drug therapy: Secondary | ICD-10-CM | POA: Diagnosis not present

## 2019-11-01 DIAGNOSIS — R202 Paresthesia of skin: Secondary | ICD-10-CM

## 2019-11-01 NOTE — Telephone Encounter (Signed)
medicaid order sent to GI. They will obtain the auth and reach out to the patient to schedule.  °

## 2019-11-01 NOTE — Progress Notes (Addendum)
[facility]  PATIENT: Aaron Owens DOB: 21-Sep-2000  REFERRING DOCTOR OR PCP:  Dr. Eudelia Bunch SOURCE: Patient, notes from the emergency room, imaging and lab reports, MRI images personally reviewed.  _________________________________   HISTORICAL  CHIEF COMPLAINT:  Chief Complaint  Patient presents with  . Follow-up    RM 13 with Aunt, Nina (temp: 97.4). Last seen 08/07/2019. Feeling well, no new sx.   . Multiple Sclerosis    On Ocrevus. Last infusion 09/10/19. Next one: 03/10/20. Receives at [facility].  PA approved 08/16/19-08/10/20.    HISTORY OF PRESENT ILLNESS:  [name] is a 19 y.o. man with relapsing remitting multiple sclerosis.  Update 11/01/2019: He had his Ocrevus infusion in February 2021 and he tolerated it well.      His numbness completely resolved.   Gait improved and he is running, jumpmping and going up/down stairs without issues.   Strength is good,  Vision is normal.  He had double vision in the past but none recently.  Bladder fucntion is fine.    He denies fatigue.  He sleeps well most nights.  Mood is doing well.  He does not note any cognitive issues.  MS History:  He had the onset of groin numbness followed by leg numbness and poor gait/balance on 07/29/2019.  The numbness felt like he had been sitting on the toilet for too long.  When symptoms persisted, he went to the emergency room on 07/31/2019.     He did not have any weakness or visual symptoms.   He noted no change in bladder function.   In the emergency room, he had a lumbar spine MRI which was read as normal.  This was followed by a lumbar puncture and MRI of the spine.  The lumbar puncture showed a mild pleocytosis.  The MRI of the brain and spinal cord showed multiple foci consistent with MS.  For foci in the spinal cord and multiple foci in the brain enhanced after contrast.  He was admitted and received IV Solumedrol x 3 days.   Due to rapid improvement, he  was discharged after 3 days of IV steroids and received 2 more days of high-dose oral prednisone (1250 mg).  He feels much better with balance almost baseline and numbness close to baseline.    In 2018, he had similar but milder numbness in the groin x 2 days.  At that time, he had no trouble with gait.  Symptoms improved over 1 to 2 weeks.  In early 2020, he had diplopia for 5-6 days.   He set up an ophthalmology appointment but was back to baseline before the visit so he canceled the visit.   He feels vision on the left may be a little worse than the right but colors are symmetric.    His maternal great grandmother had MS.    Data: MRI brain 07/31/2019: There are multiple T2/FLAIR hyperintense foci in the hemispheres and also in the brainstem.  One focus in the right pons enhances after contrast.  Several 8 foci in the hemispheres also enhance after contrast.  Other foci did not enhance.  MRI cervical spine 08/01/2019 shows several T2 hyperintense foci within the spinal cord.  One adjacent to C4 posteriorly enhances after contrast.  MRI of the thoracic spine 08/01/2019 shows foci adjacent to T10, T11 and from T12 to the conus medullaris.  All of these enhance.  MRI lumbar spine 07/31/2019 showed no degenerative changes.  Lumbar puncture 07/31/2019 showed  oligoclonal bands and elevated IgG index.  9 white blood cells per cubic millimeter were seen (lymphocytes).  14 glucose was normal.  Laboratory tests 07/31/2019: HIV, TSH, CBC with differential, B12, thiamine, ANA, Lyme, ESR, CRP were normal or negative.   Urine tox screen showed cannabinoid.     REVIEW OF SYSTEMS: Constitutional: No fevers, chills, sweats, or change in appetite Eyes: No visual changes, double vision, eye pain Ear, nose and throat: No hearing loss, ear pain, nasal congestion, sore throat Cardiovascular: No chest pain, palpitations Respiratory: No shortness of breath at rest or with exertion.   No wheezes GastrointestinaI:  No nausea, vomiting, diarrhea, abdominal pain, fecal incontinence Genitourinary: No dysuria, urinary retention or frequency.  No nocturia. Musculoskeletal: No neck pain, back pain Integumentary: No rash, pruritus, skin lesions Neurological: as above Psychiatric: No depression at this time.  No anxiety Endocrine: No palpitations, diaphoresis, change in appetite, change in weigh or increased thirst Hematologic/Lymphatic: No anemia, purpura, petechiae. Allergic/Immunologic: No itchy/runny eyes, nasal congestion, recent allergic reactions, rashes  ALLERGIES: No Known Allergies  HOME MEDICATIONS:  Current Outpatient Medications:  .  ocrelizumab (OCREVUS) 300 MG/10ML injection, Inject 600 mg into the vein every 6 (six) months., Disp: , Rfl:   PAST MEDICAL HISTORY: Past Medical History:  Diagnosis Date  . Multiple sclerosis ([place])     PAST SURGICAL HISTORY: Past Surgical History:  Procedure Laterality Date  . NO PAST SURGERIES      FAMILY HISTORY: History reviewed. No pertinent family history.  SOCIAL HISTORY:  Social History   Socioeconomic History  . Marital status: Single    Spouse name: Not on file  . Number of children: 0  . Years of education: 42  . Highest education level: Not on file  Occupational History  . Not on file  Tobacco Use  . Smoking status: Never Smoker  . Smokeless tobacco: Never Used  Substance and Sexual Activity  . Alcohol use: Not Currently  . Drug use: Not Currently  . Sexual activity: Not on file  Other Topics Concern  . Not on file  Social History Narrative   Right handed   Caffeine: none   Lives with family   Social Determinants of Health   Financial Resource Strain:   . Difficulty of Paying Living Expenses:   Food Insecurity:   . Worried About Charity fundraiser in the Last Year:   . Arboriculturist in the Last Year:   Transportation Needs:   . Film/video editor (Medical):   [name] Lack of Transportation (Non-Medical):     Physical Activity:   . Days of Exercise per Week:   . Minutes of Exercise per Session:   Stress:   . Feeling of Stress :   Social Connections:   . Frequency of Communication with Friends and Family:   . Frequency of Social Gatherings with Friends and Family:   . Attends Religious Services:   . Active Member of Clubs or Organizations:   . Attends Archivist Meetings:   [name] Marital Status:   Intimate Partner Violence:   . Fear of Current or Ex-Partner:   . Emotionally Abused:   [name] Physically Abused:   . Sexually Abused:      PHYSICAL EXAM  Vitals:   11/01/19 1035  BP: 110/78  Pulse: (!) 57  Temp: (!) 97.2 F (36.2 C)  SpO2: 98%  Weight: 142 lb (64.4 kg)  Height: '5\' 7"'$  (1.702 m)    Body mass index is  22.24 kg/m.   General: The patient is well-developed and well-nourished and in no acute distress  Skin: Extremities are without rash or  edema.  Neurologic Exam  Mental status: The patient is alert and oriented x 3 at the time of the examination. The patient has apparent normal recent and remote memory, with an apparently normal attention span and concentration ability.   Speech is normal.  Cranial nerves: Extraocular movements are full.  Visual acuity and color vision was symmetric today.  There is good facial sensation to soft touch bilaterally.Facial strength is normal.  Trapezius and sternocleidomastoid strength is normal. No dysarthria is noted.    No obvious hearing deficits are noted.  Motor:  Muscle bulk is normal.   Tone is normal. Strength is  5 / 5 in all 4 extremities.   Sensory: Sensory testing is intact to pinprick, soft touch and vibration sensation in the arms but he has reduced touch sensation in the legs, more on the right.  Coordination: Cerebellar testing reveals good finger-nose-finger and heel-to-shin bilaterally.  Gait and station: Station is normal.   Gait is slightly wide and tandem gait is wide.. Romberg is negative.   Reflexes: Deep  tendon reflexes are symmetric and normal bilaterally.         DIAGNOSTIC DATA (LABS, IMAGING, TESTING) - I reviewed patient records, labs, notes, testing and imaging myself where available.  Lab Results  Component Value Date   WBC 6.1 08/01/2019   HGB 13.8 08/01/2019   HCT 42.8 08/01/2019   MCV 84.4 08/01/2019   PLT 251 08/01/2019      Component Value Date/Time   NA 142 08/01/2019 0345   K 3.4 (L) 08/01/2019 0345   CL 104 08/01/2019 0345   CO2 29 08/01/2019 0345   GLUCOSE 92 08/01/2019 0345   BUN 13 08/01/2019 0345   CREATININE 0.89 08/01/2019 0345   CALCIUM 9.7 08/01/2019 0345   PROT 7.9 08/01/2019 0345   ALBUMIN 4.5 08/01/2019 0345   ALBUMIN 4.7 07/31/2019 2242   AST 19 08/01/2019 0345   ALT 10 08/01/2019 0345   ALKPHOS 57 08/01/2019 0345   BILITOT 1.0 08/01/2019 0345   GFRNONAA >60 08/01/2019 0345   GFRAA >60 08/01/2019 0345   No results found for: CHOL, HDL, LDLCALC, LDLDIRECT, TRIG, CHOLHDL Lab Results  Component Value Date   HGBA1C 5.7 (H) 08/01/2019   Lab Results  Component Value Date   VITAMINB12 286 08/01/2019   Lab Results  Component Value Date   TSH 1.992 08/01/2019       ASSESSMENT AND PLAN  Ataxia  Multiple sclerosis ([place]) - Plan: MR BRAIN W WO CONTRAST  High risk medication use  Numbness and tingling of both legs  History of diplopia  1.  Continue Ocrevus.  We would need to check an MRI of the brain later this year to determine if there is any subclinical progression.  If present, consider a different disease modifying 2.   Stay active and exercise as tolerated. 3.   Return in 6 months or sooner if there are new or worsening neurologic symptoms.  Adnan Vanvoorhis A. Felecia Shelling, MD, [facility] 8/35/0757, 32:25 PM Certified in Neurology, Clinical Neurophysiology, Sleep Medicine and Neuroimaging  [facility] [address], [place], [place] [address] [phone]

## 2020-01-17 DIAGNOSIS — Z419 Encounter for procedure for purposes other than remedying health state, unspecified: Secondary | ICD-10-CM | POA: Diagnosis not present

## 2020-02-02 ENCOUNTER — Emergency Department: Payer: Medicaid Other

## 2020-02-02 ENCOUNTER — Emergency Department
Admission: EM | Admit: 2020-02-02 | Discharge: 2020-02-02 | Disposition: A | Discharge status: Home / Self Care | Payer: Medicaid Other | Attending: Emergency Medicine | Admitting: Emergency Medicine

## 2020-02-02 ENCOUNTER — Other Ambulatory Visit: Payer: Self-pay

## 2020-02-02 ENCOUNTER — Encounter: Payer: Self-pay | Admitting: Emergency Medicine

## 2020-02-02 DIAGNOSIS — M542 Cervicalgia: Secondary | ICD-10-CM | POA: Insufficient documentation

## 2020-02-02 DIAGNOSIS — J029 Acute pharyngitis, unspecified: Secondary | ICD-10-CM | POA: Diagnosis not present

## 2020-02-02 LAB — GROUP A STREP BY PCR: Group A Strep by PCR: NOT DETECTED

## 2020-02-02 LAB — COMPREHENSIVE METABOLIC PANEL
ALT: 9 U/L (ref 0–44)
AST: 18 U/L (ref 15–41)
Albumin: 4.6 g/dL (ref 3.5–5.0)
Alkaline Phosphatase: 47 U/L (ref 38–126)
Anion gap: 7 (ref 5–15)
BUN: 14 mg/dL (ref 6–20)
CO2: 28 mmol/L (ref 22–32)
Calcium: 9 mg/dL (ref 8.9–10.3)
Chloride: 106 mmol/L (ref 98–111)
Creatinine, Ser: 0.83 mg/dL (ref 0.61–1.24)
GFR calc Af Amer: >60 mL/min (ref >60)
GFR calc non Af Amer: >60 mL/min (ref >60)
Glucose, Bld: 92 mg/dL (ref 70–99)
Potassium: 3.7 mmol/L (ref 3.5–5.1)
Sodium: 141 mmol/L (ref 135–145)
Total Bilirubin: 1 mg/dL (ref 0.3–1.2)
Total Protein: 7.4 g/dL (ref 6.5–8.1)

## 2020-02-02 LAB — CBC WITH DIFFERENTIAL/PLATELET
Abs Immature Granulocytes: 0 10*3/uL (ref 0.00–0.07)
Basophils Absolute: 0.1 10*3/uL (ref 0.0–0.1)
Basophils Relative: 1 %
Eosinophils Absolute: 0.1 10*3/uL (ref 0.0–0.5)
Eosinophils Relative: 2 %
HCT: 40.5 % (ref 39.0–52.0)
Hemoglobin: 13.9 g/dL (ref 13.0–17.0)
Immature Granulocytes: 0 %
Lymphocytes Relative: 57 %
Lymphs Abs: 1.9 10*3/uL (ref 0.7–4.0)
MCH: 28 pg (ref 26.0–34.0)
MCHC: 34.3 g/dL (ref 30.0–36.0)
MCV: 81.5 fL (ref 80.0–100.0)
Monocytes Absolute: 0.3 10*3/uL (ref 0.1–1.0)
Monocytes Relative: 7 %
Neutro Abs: 1.1 10*3/uL — ABNORMAL LOW (ref 1.7–7.7)
Neutrophils Relative %: 33 %
Platelets: 280 10*3/uL (ref 150–400)
RBC: 4.97 MIL/uL (ref 4.22–5.81)
RDW: 12.4 % (ref 11.5–15.5)
WBC: 3.5 10*3/uL — ABNORMAL LOW (ref 4.0–10.5)
nRBC: 0 % (ref 0.0–0.2)

## 2020-02-02 LAB — TSH: TSH: 1.469 u[IU]/mL (ref 0.350–4.500)

## 2020-02-02 LAB — T4, FREE: Free T4: 0.97 ng/dL (ref 0.61–1.12)

## 2020-02-02 MED ORDER — IOHEXOL 300 MG/ML  SOLN
75.0000 mL | Freq: Once | INTRAMUSCULAR | Status: AC | PRN
  Administered 2020-02-02: 75 mL via INTRAVENOUS
  Filled 2020-02-02: qty 75

## 2020-02-02 MED ORDER — TRAMADOL HCL 50 MG PO TABS: 50.0000 mg | ORAL_TABLET | Freq: Four times a day (QID) | ORAL | 0 refills | Status: AC | PRN

## 2020-02-02 MED ORDER — NAPROXEN 500 MG PO TBEC: 500.0000 mg | DELAYED_RELEASE_TABLET | Freq: Two times a day (BID) | ORAL | 0 refills | Status: AC

## 2020-02-02 NOTE — ED Triage Notes (Signed)
PT arrived via POV with c/o pain in throat x 2 days, denies pain with swallowing but states it hurts all the time

## 2020-02-02 NOTE — Discharge Instructions (Signed)
You can take tramadol and naproxen for pain.

## 2020-02-02 NOTE — ED Provider Notes (Signed)
Emergency Department Provider Note  ____________________________________________  Time seen: Approximately 9:37 PM  I have reviewed the triage vital signs and the nursing notes.   HISTORY  Chief Complaint Sore Throat   Historian Patient     HPI Aaron Owens is a 19 y.o. male with a history of MS, presents to the emergency department with left-sided anterior neck pain for the past 2 days.  Patient states that left-sided anterior neck pain has worsened acutely over the past 2 days and currently rates pain at 8/10 in intensity.  There has been no pain underneath the tongue and patient has been able to manage his own secretions at home.  Patient denies pharyngitis. He denies difficulty swallowing.  No voice changes at home.  Pain is not reproduced with range of motion of the neck.  No associated rhinorrhea, nasal congestion or nonproductive cough.  No similar symptoms in the past.   Past Medical History:  Diagnosis Date  . Multiple sclerosis ([facility])      Immunizations up to date:  Yes.     Past Medical History:  Diagnosis Date  . Multiple sclerosis [facility])     Patient Active Problem List   Diagnosis Date Noted  . Multiple sclerosis ([facility]) 08/07/2019  . High risk medication use 08/07/2019  . Numbness and tingling of both legs 08/07/2019  . Ataxia 08/07/2019  . History of diplopia 08/07/2019  . Lower extremity weakness 08/01/2019    Past Surgical History:  Procedure Laterality Date  . NO PAST SURGERIES      Prior to Admission medications   Medication Sig Start Date End Date Taking? Authorizing Provider  naproxen (EC NAPROSYN) 500 MG EC tablet Take 1 tablet (500 mg total) by mouth 2 (two) times daily with a meal for 10 days. 02/02/20 02/12/20  Orvil Feil, PA-C  ocrelizumab (OCREVUS) 300 MG/10ML injection Inject 600 mg into the vein every 6 (six) months.    [provider]  traMADol (ULTRAM) 50 MG tablet Take 1 tablet (50 mg total) by mouth every 6 (six)  hours as needed for up to 3 days. 02/02/20 02/05/20  Orvil Feil, PA-C    Allergies Patient has no known allergies.  History reviewed. No pertinent family history.  Social History Social History   Tobacco Use  . Smoking status: Never Smoker  . Smokeless tobacco: Never Used  Substance Use Topics  . Alcohol use: Not Currently  . Drug use: Not Currently     Review of Systems  Constitutional: No fever/chills Eyes:  No discharge ENT: Patient has anterior neck pain.  Respiratory: no cough. No SOB/ use of accessory muscles to breath Gastrointestinal:   No nausea, no vomiting.  No diarrhea.  No constipation. Musculoskeletal: Negative for musculoskeletal pain. Skin: Negative for rash, abrasions, lacerations, ecchymosis.   ____________________________________________   PHYSICAL EXAM:  VITAL SIGNS: ED Triage Vitals  Enc Vitals Group     BP 02/02/20 1909 (!) 144/94     Pulse Rate 02/02/20 1909 87     Resp 02/02/20 1909 18     Temp 02/02/20 1909 98.9 F (37.2 C)     Temp Source 02/02/20 1909 Oral     SpO2 02/02/20 1909 96 %     Weight 02/02/20 1910 140 lb (63.5 kg)     Height 02/02/20 1910 5\' 7"  (1.702 m)     Head Circumference --      Peak Flow --      Pain Score 02/02/20 1920 7  Pain Loc --      Pain Edu? --      Excl. in GC? --      Constitutional: Alert and oriented. Well appearing and in no acute distress. Eyes: Conjunctivae are normal. PERRL. EOMI. Head: Atraumatic. ENT:      Nose: No congestion/rhinnorhea.      Mouth/Throat: Mucous membranes are moist.  Posterior pharynx is mildly erythematous.  Uvula is midline. Neck: No stridor. No cervical spine tenderness to palpation. Hematological/Lymphatic/Immunilogical: Patient has palpable anterior cervical lymphadenopathy on the left. Cardiovascular: Normal rate, regular rhythm. Normal S1 and S2.  Good peripheral circulation. Respiratory: Normal respiratory effort without tachypnea or retractions. Lungs CTAB.  Good air entry to the bases with no decreased or absent breath sounds Gastrointestinal: Bowel sounds x 4 quadrants. Soft and nontender to palpation. No guarding or rigidity. No distention. Musculoskeletal: Full range of motion to all extremities. No obvious deformities noted Neurologic:  Normal for age. No gross focal neurologic deficits are appreciated.  Skin:  Skin is warm, dry and intact. No rash noted. Psychiatric: Mood and affect are normal for age. Speech and behavior are normal.   ____________________________________________   LABS (all labs ordered are listed, but only abnormal results are displayed)  Labs Reviewed  CBC WITH DIFFERENTIAL/PLATELET - Abnormal; Notable for the following components:      Result Value   WBC 3.5 (*)    Neutro Abs 1.1 (*)    All other components within normal limits  GROUP A STREP BY PCR  COMPREHENSIVE METABOLIC PANEL  T4, FREE  TSH   ____________________________________________  EKG   ____________________________________________  RADIOLOGY Geraldo Pitter, personally viewed and evaluated these images (plain radiographs) as part of my medical decision making, as well as reviewing the written report by the radiologist.    CT Soft Tissue Neck W Contrast  Result Date: 02/02/2020 CLINICAL DATA:  Initial evaluation for acute sore throat. EXAM: CT NECK WITH CONTRAST TECHNIQUE: Multidetector CT imaging of the neck was performed using the standard protocol following the bolus administration of intravenous contrast. CONTRAST:  23mL OMNIPAQUE IOHEXOL 300 MG/ML  SOLN COMPARISON:  None. FINDINGS: Pharynx and larynx: Oral cavity within normal limits. No acute inflammatory changes seen about the dentition. [place] tonsils symmetric and within normal limits. No findings to suggest acute tonsillitis. No tonsillar or peritonsillar abscess. Parapharyngeal fat maintained. Remainder of the oropharynx in nasopharynx within normal limits. No retropharyngeal  collection or swelling. Epiglottis within normal limits. Vallecula clear. Remainder of the hypopharynx and supraglottic larynx within normal limits. True cords symmetric and normal. Subglottic airway clear. Salivary glands: Salivary glands including the parotid and submandibular glands are within normal limits. Thyroid: Normal. Lymph nodes: No pathologically enlarged lymph nodes seen within the neck. Vascular: Normal intravascular enhancement seen throughout the neck. Limited intracranial: Unremarkable. Visualized orbits: Unremarkable. Mastoids and visualized paranasal sinuses: Small osteoma partially visualized at the right frontal sinus. Visualized paranasal sinuses are otherwise clear. Visualize mastoids and middle ear cavities are well pneumatized and free of fluid. Skeleton: No acute osseous abnormality. No discrete or worrisome osseous lesions. Upper chest: Visualized upper chest demonstrates no acute finding. Partially visualized lungs are clear. Other: None. IMPRESSION: Normal CT of the neck. No acute inflammatory changes or other abnormality identified. Electronically Signed   By: Rise Mu M.D.   On: 02/02/2020 23:08    ____________________________________________    PROCEDURES  Procedure(s) performed:     Procedures     Medications  iohexol (OMNIPAQUE) 300 MG/ML solution  75 mL (75 mLs Intravenous Contrast Given 02/02/20 2241)     ____________________________________________   INITIAL IMPRESSION / ASSESSMENT AND PLAN / ED COURSE  Pertinent labs & imaging results that were available during my care of the patient were reviewed by me and considered in my medical decision making (see chart for details).      Assessment and Plan: Anterior neck pain 19 year old male presents to the emergency department with left-sided anterior neck pain for the past 2 days.  Patient was mildly hypertensive at triage but vital signs were otherwise reassuring.  On physical exam,  patient had reproducible tenderness to palpation along left anterior neck.  He did have some palpable anterior lymphadenopathy.  No appreciable swelling.  There was no pain to palpation underneath the tongue.  Patient was able to swallow and manage his own secretions.  Differential diagnosis includes cervical lymphadenopathy, retropharyngeal abscess, Ludwig's, Lemierres...  CT soft tissue neck reveals no evidence of abscess.  CMP revealed mild leukopenia but no other abnormalities.  CMP was within reference range.  Strep testing was negative.  TSH and T4 were within reference range.  Patient was prescribed tramadol and naproxen at discharge.  He was advised to follow-up with primary care as needed.  All patient questions were answered.  ____________________________________________  FINAL CLINICAL IMPRESSION(S) / ED DIAGNOSES  Final diagnoses:  Anterior neck pain      NEW MEDICATIONS STARTED DURING THIS VISIT:  ED Discharge Orders         Ordered    traMADol (ULTRAM) 50 MG tablet  Every 6 hours PRN     Discontinue  Reprint     02/02/20 2322    naproxen (EC NAPROSYN) 500 MG EC tablet  2 times daily with meals     Discontinue  Reprint     02/02/20 2322              This chart was dictated using voice recognition software/Dragon. Despite best efforts to proofread, errors can occur which can change the meaning. Any change was purely unintentional.     Gasper Lloyd 02/02/20 2331    Sharman Cheek, MD 02/03/20 0020

## 2020-02-12 DIAGNOSIS — Z0271 Encounter for disability determination: Secondary | ICD-10-CM

## 2020-02-17 DIAGNOSIS — Z419 Encounter for procedure for purposes other than remedying health state, unspecified: Secondary | ICD-10-CM | POA: Diagnosis not present

## 2020-03-10 ENCOUNTER — Other Ambulatory Visit: Payer: Self-pay

## 2020-03-10 ENCOUNTER — Ambulatory Visit (HOSPITAL_COMMUNITY)
Admission: RE | Admit: 2020-03-10 | Discharge: 2020-03-10 | Disposition: A | Discharge status: Home / Self Care | Payer: Medicaid Other | Source: Ambulatory Visit | Attending: Internal Medicine | Admitting: Internal Medicine

## 2020-03-10 DIAGNOSIS — G35 Multiple sclerosis: Secondary | ICD-10-CM | POA: Diagnosis not present

## 2020-03-10 MED ORDER — ACETAMINOPHEN 325 MG PO TABS
650.0000 mg | ORAL_TABLET | Freq: Once | ORAL | Status: AC
  Administered 2020-03-10: 650 mg via ORAL
  Filled 2020-03-10: qty 2

## 2020-03-10 MED ORDER — METHYLPREDNISOLONE SODIUM SUCC 125 MG IJ SOLR
125.0000 mg | Freq: Once | INTRAMUSCULAR | Status: AC
  Administered 2020-03-10: 125 mg via INTRAVENOUS
  Filled 2020-03-10: qty 2

## 2020-03-10 MED ORDER — SODIUM CHLORIDE 0.9 % IV SOLN
600.0000 mg | Freq: Once | INTRAVENOUS | Status: AC
  Administered 2020-03-10: 600 mg via INTRAVENOUS
  Filled 2020-03-10: qty 20

## 2020-03-10 MED ORDER — SODIUM CHLORIDE 0.9 % IV SOLN
INTRAVENOUS | Status: DC | PRN
  Administered 2020-03-10: 250 mL via INTRAVENOUS

## 2020-03-10 MED ORDER — FAMOTIDINE IN NACL 20-0.9 MG/50ML-% IV SOLN
20.0000 mg | Freq: Once | INTRAVENOUS | Status: AC
  Administered 2020-03-10: 20 mg via INTRAVENOUS
  Filled 2020-03-10: qty 50

## 2020-03-10 MED ORDER — DIPHENHYDRAMINE HCL 50 MG/ML IJ SOLN
50.0000 mg | Freq: Once | INTRAMUSCULAR | Status: AC
  Administered 2020-03-10: 50 mg via INTRAVENOUS
  Filled 2020-03-10: qty 1

## 2020-03-10 NOTE — Discharge Instructions (Signed)
Ocrelizumab injection °What is this medicine? °OCRELIZUMAB (ok re LIZ ue mab) treats multiple sclerosis. It helps to decrease the number of multiple sclerosis relapses. It is not a cure. °This medicine may be used for other purposes; ask your health care provider or pharmacist if you have questions. °COMMON BRAND NAME(S): OCREVUS °What should I tell my health care provider before I take this medicine? °They need to know if you have any of these conditions: °· cancer °· hepatitis B infection °· other infection (especially a virus infection such as chickenpox, cold sores, or herpes) °· an unusual or allergic reaction to ocrelizumab, other medicines, foods, dyes or preservatives °· pregnant or trying to get pregnant °· breast-feeding °How should I use this medicine? °This medicine is for infusion into a vein. It is given by a health care professional in a hospital or clinic setting. °A special MedGuide will be given to you before each treatment. Be sure to read this information carefully each time. °Talk to your pediatrician regarding the use of this medicine in children. Special care may be needed. °Overdosage: If you think you have taken too much of this medicine contact a poison control center or emergency room at once. °NOTE: This medicine is only for you. Do not share this medicine with others. °What if I miss a dose? °Keep appointments for follow-up doses as directed. It is important not to miss your dose. Call your doctor or health care professional if you are unable to keep an appointment. °What may interact with this medicine? °· alemtuzumab °· daclizumab °· dimethyl fumarate °· fingolimod °· glatiramer °· interferon beta °· live virus vaccines °· mitoxantrone °· natalizumab °· peginterferon beta °· rituximab °· steroid medicines like prednisone or cortisone °· teriflunomide °This list may not describe all possible interactions. Give your health care provider a list of all the medicines, herbs,  non-prescription drugs, or dietary supplements you use. Also tell them if you smoke, drink alcohol, or use illegal drugs. Some items may interact with your medicine. °What should I watch for while using this medicine? °Tell your doctor or healthcare professional if your symptoms do not start to get better or if they get worse. °This medicine can cause serious allergic reactions. To reduce your risk you may need to take medicine before treatment with this medicine. Take your medicine as directed. °Women should inform their doctor if they wish to become pregnant or think they might be pregnant. There is a potential for serious side effects to an unborn child. Talk to your health care professional or pharmacist for more information. Male patients should use effective birth control methods while receiving this medicine and for 6 months after the last dose. °Call your doctor or health care professional for advice if you get a fever, chills or sore throat, or other symptoms of a cold or flu. Do not treat yourself. This drug decreases your body's ability to fight infections. Try to avoid being around people who are sick. °If you have a hepatitis B infection or a history of a hepatitis B infection, talk to your doctor. The symptoms of hepatitis B may get worse if you take this medicine. °In some patients, this medicine may cause a serious brain infection that may cause death. If you have any problems seeing, thinking, speaking, walking, or standing, tell your doctor right away. If you cannot reach your doctor, urgently seek other source of medical care. °This medicine can decrease the response to a vaccine. If you need to get   vaccinated, tell your healthcare professional if you have received this medicine. Extra booster doses may be needed. Talk to your doctor to see if a different vaccination schedule is needed. °Talk to your doctor about your risk of cancer. You may be more at risk for certain types of cancers if you  take this medicine. °What side effects may I notice from receiving this medicine? °Side effects that you should report to your doctor or health care professional as soon as possible: °· allergic reactions like skin rash, itching or hives, swelling of the face, lips, or tongue °· breathing problems °· facial flushing °· fast, irregular heartbeat °· lump or soreness in the breast °· signs and symptoms of herpes such as cold sore, shingles, or genital sores °· signs and symptoms of infection like fever or chills, cough, sore throat, pain or trouble passing urine °· signs and symptoms of low blood pressure like dizziness; feeling faint or lightheaded, falls; unusually weak or tired °· signs and symptoms of progressive multifocal leukoencephalopathy (PML) like changes in vision; clumsiness; confusion; personality changes; weakness on one side of the body °· swelling of the ankles, feet, hands °Side effects that usually do not require medical attention (report these to your doctor or health care professional if they continue or are bothersome): °· back pain °· depressed mood °· diarrhea °· pain, redness, or irritation at site where injected °This list may not describe all possible side effects. Call your doctor for medical advice about side effects. You may report side effects to FDA at 1-800-FDA-1088. °Where should I keep my medicine? °This drug is given in a hospital or clinic and will not be stored at home. °NOTE: This sheet is a summary. It may not cover all possible information. If you have questions about this medicine, talk to your doctor, pharmacist, or health care provider. °© 2020 Elsevier/Gold Standard (2018-07-10 07:41:53) ° °

## 2020-03-10 NOTE — Progress Notes (Signed)
PATIENT CARE CENTER NOTE  Diagnosis:Multiple Sclerosis   Provider:Sater, Richard, MD   Procedure:Ocrevus 600 mg IV   Note:Patient received second Ocrevus 600 mg infusion via PIV. Pre-medications given (Tylenol, Solumedrol, Benadryl, Famotidine) per order. Infusion titrated per protocol. Patient tolerated infusion well with no adverse reaction. Vital signs stable. Patient declined 1 hour post-infusion observation. Discharge instructions given. Patient is to come back every 6 months for 600 mg infusion. Alert, oriented and ambulatory at discharge.

## 2020-03-19 DIAGNOSIS — Z419 Encounter for procedure for purposes other than remedying health state, unspecified: Secondary | ICD-10-CM | POA: Diagnosis not present

## 2020-04-18 DIAGNOSIS — Z419 Encounter for procedure for purposes other than remedying health state, unspecified: Secondary | ICD-10-CM | POA: Diagnosis not present

## 2020-05-05 ENCOUNTER — Encounter: Payer: Self-pay | Admitting: Neurology

## 2020-05-05 ENCOUNTER — Ambulatory Visit: Payer: Medicaid Other | Admitting: Neurology

## 2020-05-19 DIAGNOSIS — Z419 Encounter for procedure for purposes other than remedying health state, unspecified: Secondary | ICD-10-CM | POA: Diagnosis not present

## 2020-06-16 ENCOUNTER — Other Ambulatory Visit: Payer: Self-pay | Admitting: Obstetrics and Gynecology

## 2020-06-16 NOTE — Patient Outreach (Signed)
Care Coordination  06/16/2020  Aaron Owens [birth year] [id number]   An unsuccessful telephone outreach was attempted today. The patient was referred to the case management team for assistance with care management and care coordination.   Follow Up Plan: The Managed Medicaid care management team will reach out to the patient again over the next 7-14 days.   [name] RN, BSN [place]  [employer], production - Managed Medicaid High Risk [phone]

## 2020-06-16 NOTE — Patient Instructions (Signed)
Hi Aaron Owens we missed you today- as a part of your Medicaid benefit, you are eligible for care management and care coordination services at no cost or copay. I was unable to reach you by phone today but would be happy to help you with your health related needs. Please feel free to call me at [phone].  A member of the Managed Medicaid care management team will reach out to you again over the next 7-14 days.   [name] RN, BSN [place]  [employer], production - Managed Medicaid High Risk [phone].

## 2020-06-18 DIAGNOSIS — Z419 Encounter for procedure for purposes other than remedying health state, unspecified: Secondary | ICD-10-CM | POA: Diagnosis not present

## 2020-06-27 ENCOUNTER — Other Ambulatory Visit: Payer: Self-pay | Admitting: Obstetrics and Gynecology

## 2020-06-27 ENCOUNTER — Other Ambulatory Visit: Payer: Self-pay

## 2020-06-27 NOTE — Patient Outreach (Signed)
   Care Coordination - Case Manager  06/27/2020  Damoney Marchant 17-Jul-2001 [id number]  Subjective:  Yang Kobayashi is an 19 y.o. year old male who is a primary patient of Patient, No Pcp Per.  Mr. Noga was given information about Medicaid Managed Care team care coordination services today. Jahleel Diliberto agreed to services and verbal consent obtained  Review of patient status, laboratory and other test data was performed as part of evaluation for provision of services.     Objective:    No Known Allergies  Medications:    Medications Reviewed Today    Reviewed by Danie Chandler, RN (Registered Nurse) on 06/27/20 at 1309  Med List Status: <None>  Medication Order Taking? Sig Documenting Provider Last Dose Status Informant  ocrelizumab (OCREVUS) 300 MG/10ML injection [id number] No Inject 600 mg into the vein every 6 (six) months. [provider] Taking Active          Assessment:   Patient Care Plan: Multiple Sclerosis (Adult)      Long-Range Goal: Symptom Relapse Prevented or Managed   Start Date: 06/27/2020  This Visit's Progress: On track  Priority: High  Note:   Current Barriers:  . Chronic Disease Management support for MS.  Nurse Case Manager Clinical Goal(s):  [name] Over the next 90 days, patient will attend all scheduled medical appointments:   Interventions:  . Inter-disciplinary care team collaboration (see longitudinal plan of care) . Evaluation of current treatment plan related to MS and patient's adherence to plan as established by provider. . Reviewed medications with patient. . Discussed plans with patient for ongoing care management follow up and provided patient with direct contact information for care management team . Reviewed scheduled/upcoming provider appointments.  Patient Goals/Self-Care Activities Over the next 90 days, patient will:  -Attends all scheduled provider appointments Calls provider office for new concerns or  questions  Follow Up Plan: The Managed Medicaid care management team will reach out to the patient again over the next 30 days.  The patient has been provided with contact information for the Managed Medicaid care management team and has been advised to call with any health related questions or concerns.         Plan: RNCM will follow up with patient within 30 days. Follow-up:  Patient agrees to Care Plan and Follow-up.

## 2020-06-27 NOTE — Patient Instructions (Signed)
Hi Aaron Owens, thank you for speaking with me today.  Aaron Owens was given information about Medicaid Managed Care team care coordination services as a part of their [facility] Medicaid benefit. Aaron Owens verbally consented to engagement with the [facility] Managed Care team.   For questions related to your [facility] health plan, please call: [phone]  If you would like to schedule transportation through your [facility] plan, please call the following number at least 2 days in advance of your appointment: [phone]   Patient Care Plan: Multiple Sclerosis (Adult)      Long-Range Goal: Symptom Relapse Prevented or Managed   Start Date: 06/27/2020  This Visit's Progress: On track  Priority: High  Note:   Current Barriers:  . Chronic Disease Management support for MS.  Nurse Case Manager Clinical Goal(s):  [name] Over the next 90 days, patient will attend all scheduled medical appointments:   Interventions:  . Inter-disciplinary care team collaboration (see longitudinal plan of care) . Evaluation of current treatment plan related to MS and patient's adherence to plan as established by provider. . Reviewed medications with patient. . Discussed plans with patient for ongoing care management follow up and provided patient with direct contact information for care management team . Reviewed scheduled/upcoming provider appointments.  Patient Goals/Self-Care Activities Over the next 90 days, patient will:  -Attends all scheduled provider appointments Calls provider office for new concerns or questions  Follow Up Plan: The Managed Medicaid care management team will reach out to the patient again over the next 30 days.  The patient has been provided with contact information for the Managed Medicaid care management team and has been advised to call with any health related questions or concerns.         Patient verbalizes understanding of instructions provided today.    The Managed Medicaid care management team will reach out to the patient again over the next 30 days.  The patient has been provided with contact information for the Managed Medicaid care management team and has been advised to call with any health related questions or concerns.   [name] RN, BSN   Triad Engineer, production - Managed Medicaid High Risk [phone].

## 2020-07-19 DIAGNOSIS — Z419 Encounter for procedure for purposes other than remedying health state, unspecified: Secondary | ICD-10-CM | POA: Diagnosis not present

## 2020-07-28 ENCOUNTER — Other Ambulatory Visit: Payer: Self-pay | Admitting: Obstetrics and Gynecology

## 2020-07-28 NOTE — Patient Instructions (Signed)
Hi Mr. Swire we missed you today  - as a part of your Medicaid benefit, you are eligible for care management and care coordination services at no cost or copay. I was unable to reach you by phone today but would be happy to help you with your health related needs. Please feel free to call meat [phone].  A member of the Managed Medicaid care management team will reach out to you again over the next 7 days.   [name] RN, BSN [place]  Triad Engineer, production - Managed Medicaid High Risk [phone].

## 2020-07-28 NOTE — Patient Outreach (Signed)
Care Coordination  07/28/2020  [name] 10/29/2000 [id number]    Medicaid Managed Care   Unsuccessful Outreach Note  07/28/2020 Name: Aaron Owens MRN: [id number] DOB: [birth year]  Referred by: Patient, No Pcp Per Reason for referral : High Risk Managed Medicaid (Unsuccessful telephone outreach)   An unsuccessful telephone outreach was attempted today. The patient was referred to the case management team for assistance with care management and care coordination.   Follow Up Plan: A member of the Managed Medicaid care management team will reach out to the patient again over the next 7 days to reschedule appointment.  [name] RN, BSN [place]  Triad Engineer, production - Managed Medicaid High Risk [phone].

## 2020-08-19 DIAGNOSIS — Z419 Encounter for procedure for purposes other than remedying health state, unspecified: Secondary | ICD-10-CM | POA: Diagnosis not present

## 2020-09-08 ENCOUNTER — Telehealth: Payer: Self-pay | Admitting: *Deleted

## 2020-09-08 NOTE — Telephone Encounter (Signed)
Called pt at [phone]. VM not set up to leave a message.   Called Coralee [place] ([place]) at [phone]. She has not spoken with him recently. She will try to get in touch with him to let him know infusion on Wed has to be cx. He needs to be seen in office first and get infusion re-authorized again prior to going for another infusion. I provided her our office phone # to give him.

## 2020-09-10 ENCOUNTER — Ambulatory Visit (HOSPITAL_COMMUNITY)
Admission: RE | Admit: 2020-09-10 | Discharge: 2020-09-10 | Disposition: A | Discharge status: Home / Self Care | Payer: PRIVATE HEALTH INSURANCE | Source: Ambulatory Visit

## 2020-09-10 NOTE — Telephone Encounter (Signed)
Pt called back. Rescheduled for sooner appt on 09/15/20 at 2pm with Dr. Epimenio Foot.

## 2020-09-10 NOTE — Telephone Encounter (Signed)
Tried calling patient at [phone]. VM not set up. I see he was scheduled for appt on 10/21/20. He needs sooner appt than that.  Ignacia Bay[place] ([place]) at [phone]. Asked he call office back and ask to speak with Kara Mead (nurse) to get sooner appt scheduled. She verbalized understanding and will let him know.

## 2020-09-15 ENCOUNTER — Ambulatory Visit: Payer: Self-pay | Admitting: Neurology

## 2020-09-15 ENCOUNTER — Encounter: Payer: Self-pay | Admitting: Neurology

## 2020-09-15 NOTE — Telephone Encounter (Signed)
Tried calling pt at [phone]. VM not set up to leave message. He missed appt at 2pm today with Dr. Epimenio Foot, we need to get that r/s for him to continue on MS therapy.

## 2020-09-16 DIAGNOSIS — Z419 Encounter for procedure for purposes other than remedying health state, unspecified: Secondary | ICD-10-CM | POA: Diagnosis not present

## 2020-09-24 ENCOUNTER — Encounter: Payer: Self-pay | Admitting: *Deleted

## 2020-09-24 NOTE — Telephone Encounter (Signed)
Sent letter to pt

## 2020-10-08 ENCOUNTER — Telehealth: Payer: Self-pay | Admitting: General Practice

## 2020-10-08 NOTE — Telephone Encounter (Signed)
Attempted to reach Aaron Owens today to get him rescheduled for a phone visit with the Managed Medicaid team. Unable to leave a message as his voicemail is not set up. Will try again in the next 7-14 days

## 2020-10-17 ENCOUNTER — Other Ambulatory Visit: Payer: Self-pay

## 2020-10-17 ENCOUNTER — Other Ambulatory Visit: Payer: Self-pay | Admitting: Obstetrics and Gynecology

## 2020-10-17 DIAGNOSIS — Z419 Encounter for procedure for purposes other than remedying health state, unspecified: Secondary | ICD-10-CM | POA: Diagnosis not present

## 2020-10-17 DIAGNOSIS — G35 Multiple sclerosis: Secondary | ICD-10-CM

## 2020-10-17 NOTE — Patient Instructions (Signed)
Hi Aaron Owens, thank you for speaking with me today.  Aaron Owens was given information about Medicaid Managed Care team care coordination services as a part of their [facility] Medicaid benefit. Aaron Owens verbally consented to engagement with the [facility] Managed Care team.   For questions related to your [facility] health plan, please call: [phone]  If you would like to schedule transportation through your [facility] plan, please call the following number at least 2 days in advance of your appointment: [phone]   Call the Behavioral Health Crisis Line at [phone], at any time, 24 hours a day, 7 days a week. If you are in danger or need immediate medical attention call 911.  Aaron Owens - following are the goals we discussed in your visit today:  Goals Addressed            This Visit's Progress   . Mobility and Function Maintained       Update 10/17/20:    Patient needs to schedule appointment with neurologist for infusion.  Evidence-based guidance:   Encourage self-care to promote maximum independence in activities of daily living and to minimize decline associated with disease process and inactivity.   Screen for fall risk, especially as kidney function worsens; encourage modifications in home or work environment to facilitate safe activity.   Prepare patient for referral to physical therapy or occupational therapy for functional mobility, safety assessment, individualized program to retrain mobility, gait or transfers using assistive device.   Evaluate and address body systems and performance deficits, such as strength, range of motion, balance and activity tolerance impairment.   Consider a muscle strengthening and exercise program, such as progressive resistive exercise and cardiovascular exercise.   Promote incremental change to achieve regular, long-term exercise that is structured and individualized to avoid adverse events.    Encourage use of  energy conservation techniques, such as preplanning and pacing activity, balancing activity with periods of rest and positioning for optimal function.   Assess cognition and address impairments with interventions, such as orientation cues in the environment, memory aids or compensatory techniques; individualize patient education to cognitive ability.      Patient verbalizes understanding of instructions provided today.   The Managed Medicaid care management team will reach out to the patient again over the next 30 days.  The patient has been provided with contact information for the Managed Medicaid care management team and has been advised to call with any health related questions or concerns.   [name] RN, BSN [place]  Triad Engineer, production - Managed Medicaid High Risk [phone].  Following is a copy of your plan of care:  Patient Care Plan: Multiple Sclerosis (Adult)    Problem Identified: Symptom Relapse     Long-Range Goal: Symptom Relapse Prevented or Managed   Start Date: 06/27/2020  Expected End Date: 01/16/2021  Recent Progress: On track  Priority: High  Note:   Current Barriers:  . Chronic Disease Management support for MS. . Patient needs PCP.  Nurse Case Manager Clinical Goal(s):  [name] Over the next 90 days, patient will attend all scheduled medical appointments:  . Over the next 30 days, patient will schedule appointment with neurologist.  Interventions:  . Inter-disciplinary care team collaboration (see longitudinal plan of care) . Evaluation of current treatment plan related to MS and patient's adherence to plan as established by provider. . Reviewed medications with patient. . Care guide referral for PCP . Discussed plans with patient for ongoing care  management follow up and provided patient with direct contact information for care management team . Reviewed scheduled/upcoming provider appointments. [name] Update 10/17/20:   patient needs to schedule appointment with neurologist for infusion.   Patient Goals/Self-Care Activities Over the next 90 days, patient will:  -Attends all scheduled provider appointments Calls provider office for new concerns or questions  Follow Up Plan: The Managed Medicaid care management team will reach out to the patient again over the next 30 days.  The patient has been provided with contact information for the Managed Medicaid care management team and has been advised to call with any health related questions or concerns.

## 2020-10-17 NOTE — Patient Outreach (Signed)
Medicaid Managed Care   Nurse Care Manager Note  10/17/2020 Name:  Kaceton Vieau MRN:  [id number] DOB:  06-Jul-2001  Rhodes Frigon is an 20 y.o. year old male who is a primary patient of Patient, No Pcp Per (Inactive).  The [facility] Managed Care Coordination team was consulted for assistance with:    healthcare management needs.  Mr. Petrosky was given information about Medicaid Managed Care Coordination team services today. Luvern Whittington agreed to services and verbal consent obtained.  Engaged with patient by telephone for follow up visit in response to provider referral for case management and/or care coordination services.   Assessments/Interventions:  Review of past medical history, allergies, medications, health status, including review of consultants reports, laboratory and other test data, was performed as part of comprehensive evaluation and provision of chronic care management services.  SDOH (Social Determinants of Health) assessments and interventions performed:   Care Plan  No Known Allergies  Medications Reviewed Today    Reviewed by Danie Chandler, RN (Registered Nurse) on 10/17/20 at 1545  Med List Status: <None>  Medication Order Taking? Sig Documenting Provider Last Dose Status Informant  ocrelizumab (OCREVUS) 300 MG/10ML injection [id number] No Inject 600 mg into the vein every 6 (six) months. [provider] Taking Active           Patient Active Problem List   Diagnosis Date Noted  . Multiple sclerosis (HCC) 08/07/2019  . High risk medication use 08/07/2019  . Numbness and tingling of both legs 08/07/2019  . Ataxia 08/07/2019  . History of diplopia 08/07/2019  . Lower extremity weakness 08/01/2019    Conditions to be addressed/monitored per PCP order:  healthcare management needs-MS.  Care Plan : Multiple Sclerosis (Adult)  Updates made by Danie Chandler, RN since 10/17/2020 12:00 AM    Problem: Symptom Relapse     Long-Range Goal: Symptom  Relapse Prevented or Managed   Start Date: 06/27/2020  Expected End Date: 01/16/2021  Recent Progress: On track  Priority: High  Note:   Current Barriers:  . Chronic Disease Management support for MS. . Patient needs PCP.  Nurse Case Manager Clinical Goal(s):  [name] Over the next 90 days, patient will attend all scheduled medical appointments:  . Over the next 30 days, patient will schedule appointment with neurologist.  Interventions:  . Inter-disciplinary care team collaboration (see longitudinal plan of care) . Evaluation of current treatment plan related to MS and patient's adherence to plan as established by provider. . Reviewed medications with patient. . Care guide referral for PCP . Discussed plans with patient for ongoing care management follow up and provided patient with direct contact information for care management team . Reviewed scheduled/upcoming provider appointments. [name] Update 10/17/20:  patient needs to schedule appointment with neurologist for infusion.   Patient Goals/Self-Care Activities Over the next 90 days, patient will:  -Attends all scheduled provider appointments Calls provider office for new concerns or questions  Follow Up Plan: The Managed Medicaid care management team will reach out to the patient again over the next 30 days.  The patient has been provided with contact information for the Managed Medicaid care management team and has been advised to call with any health related questions or concerns.    Follow Up:  Patient agrees to Care Plan and Follow-up.  Plan: The Managed Medicaid care management team will reach out to the patient again over the next 30 days. and The patient has been provided with contact information for the Managed Medicaid  care management team and has been advised to call with any health related questions or concerns.  Date/time of next scheduled RN care management/care coordination outreach:  11/18/20 at 1030.

## 2020-10-21 ENCOUNTER — Ambulatory Visit: Payer: Medicaid Other | Admitting: Neurology

## 2020-10-21 NOTE — Telephone Encounter (Signed)
Pt returned call. Please call back when available. [phone]

## 2020-10-21 NOTE — Telephone Encounter (Signed)
Called pt back. Scheduled follow up for 10/27/20 at 330pm with Dr. Epimenio Foot. Asked that he check in by 3pm. He verbalized understanding.

## 2020-10-23 ENCOUNTER — Telehealth: Payer: Self-pay | Admitting: *Deleted

## 2020-10-23 NOTE — Telephone Encounter (Signed)
   Telephone encounter was:  Successful.  10/23/2020 Name: Blessed Cotham MRN: [id number] DOB: May 07, 2001  Kegan Ditullio is a 20 y.o. year old male who is a primary care patient of Patient, No Pcp Per (Inactive) . The community resource team was consulted for assistance with insurance information and new pcp   Care guide performed the following interventions: Patient provided with information about care guide support team and interviewed to confirm resource needs.Provided information on PCP providers   Follow Up Plan:  No further follow up planned at this time. The patient has been provided with needed resources.  Alois Cliche -[facility] Guide , Embedded Care Coordination [facility], Care Management  [phone] [address] [place] , [place] [address] Email : [email or web address]

## 2020-10-27 ENCOUNTER — Ambulatory Visit: Payer: Self-pay | Admitting: Neurology

## 2020-10-27 ENCOUNTER — Encounter: Payer: Self-pay | Admitting: Neurology

## 2020-10-27 ENCOUNTER — Telehealth: Payer: Self-pay

## 2020-10-27 NOTE — Telephone Encounter (Signed)
Tried calling pt back to schedule appt. Went straight to VM and VM not set up.

## 2020-10-27 NOTE — Telephone Encounter (Signed)
Patient left a voicemail at 3:07 PM today advising [place] that he will not make his appointment at 3:30 PM today.  He would like to call back to discuss rescheduling this appointment.

## 2020-10-27 NOTE — Telephone Encounter (Signed)
Tried calling pt and had the same issue (no answer, no vm). If pt calls back, please reschedule.

## 2020-10-28 NOTE — Telephone Encounter (Signed)
Called pt, got him scheduled for appt 10/30/20 at 1:30pm w/ Dr. Epimenio Foot. Asked he check in by 1:00pm. He verbalized understanding.

## 2020-10-30 ENCOUNTER — Ambulatory Visit: Payer: Self-pay | Admitting: Neurology

## 2020-11-16 DIAGNOSIS — Z419 Encounter for procedure for purposes other than remedying health state, unspecified: Secondary | ICD-10-CM | POA: Diagnosis not present

## 2020-11-18 ENCOUNTER — Telehealth: Payer: Self-pay | Admitting: *Deleted

## 2020-11-18 ENCOUNTER — Other Ambulatory Visit: Payer: Self-pay | Admitting: Obstetrics and Gynecology

## 2020-11-18 ENCOUNTER — Other Ambulatory Visit: Payer: Self-pay

## 2020-11-18 DIAGNOSIS — G35 Multiple sclerosis: Secondary | ICD-10-CM

## 2020-11-18 NOTE — Patient Instructions (Signed)
Hi Aaron Owens, thank you for speaking with me today.  Aaron Owens was given information about Medicaid Managed Care team care coordination services as a part of their [facility] Medicaid benefit. [name] verbally consented to engagement with the [facility] Managed Care team.   For questions related to your [facility] health plan, please call: [phone]  If you would like to schedule transportation through your [facility] plan, please call the following number at least 2 days in advance of your appointment: [phone]   Call the Behavioral Health Crisis Line at [phone], at any time, 24 hours a day, 7 days a week. If you are in danger or need immediate medical attention call 911.  Aaron Owens - following are the goals we discussed in your visit today:  Goals Addressed            This Visit's Progress   . Mobility and Function Maintained       Update 10/17/20:    Patient needs to schedule appointment with neurologist for infusion. Update 11/18/20:  No appointment for infusion yet-needs to schedule neurologist appointment.  Evidence-based guidance:   Encourage self-care to promote maximum independence in activities of daily living and to minimize decline associated with disease process and inactivity.   Screen for fall risk, especially as kidney function worsens; encourage modifications in home or work environment to facilitate safe activity.   Prepare patient for referral to physical therapy or occupational therapy for functional mobility, safety assessment, individualized program to retrain mobility, gait or transfers using assistive device.   Evaluate and address body systems and performance deficits, such as strength, range of motion, balance and activity tolerance impairment.   Consider a muscle strengthening and exercise program, such as progressive resistive exercise and cardiovascular exercise.   Promote incremental change to achieve regular, long-term  exercise that is structured and individualized to avoid adverse events.    Encourage use of energy conservation techniques, such as preplanning and pacing activity, balancing activity with periods of rest and positioning for optimal function.   Assess cognition and address impairments with interventions, such as orientation cues in the environment, memory aids or compensatory techniques; individualize patient education to cognitive ability.      Patient verbalizes understanding of instructions provided today.   The Managed Medicaid care management team will reach out to the patient again over the next 30 days.  The patient has been provided with contact information for the Managed Medicaid care management team and has been advised to call with any health related questions or concerns.   [name] RN, BSN [place]  Triad Engineer, production - Managed Medicaid High Risk [phone].  Following is a copy of your plan of care:  Patient Care Plan: Multiple Sclerosis (Adult)    Problem Identified: Symptom Relapse     Long-Range Goal: Symptom Relapse Prevented or Managed   Start Date: 06/27/2020  Expected End Date: 01/16/2021  Recent Progress: On track  Priority: High  Note:   Current Barriers:  . Chronic Disease Management support for MS. . Patient needs PCP.  Nurse Case Manager Clinical Goal(s):  [name] Over the next 90 days, patient will attend all scheduled medical appointments:  . Over the next 30 days, patient will schedule appointment with neurologist.  Interventions:  . Inter-disciplinary care team collaboration (see longitudinal plan of care) . Evaluation of current treatment plan related to MS and patient's adherence to plan as established by provider. . Reviewed medications with patient. . Care  guide referral for PCP . Update 11/18/20:  Care Guide provided PCP information to patient 10/23/20.  Patient states he needs this information again-will ask  Care Guide to contact patient again at his request. . Discussed plans with patient for ongoing care management follow up and provided patient with direct contact information for care management team . Reviewed scheduled/upcoming provider appointments. [name] Update 10/17/20:  patient needs to schedule appointment with neurologist for infusion. [name] Update 11/18/20:  Patient still needs to schedule appointment with neurologist in order to have infusion.   Patient Goals/Self-Care Activities Over the next 90 days, patient will:  -Attends all scheduled provider appointments Calls provider office for new concerns or questions  Follow Up Plan: The Managed Medicaid care management team will reach out to the patient again over the next 30 days.  The patient has been provided with contact information for the Managed Medicaid care management team and has been advised to call with any health related questions or concerns.

## 2020-11-18 NOTE — Telephone Encounter (Signed)
   Telephone encounter was:  Unsuccessful.  11/18/2020 Name: Masud Holub MRN: [id number] DOB: 2001-02-21  Unsuccessful outbound call made today to assist with:  pcp  Outreach Attempt:  1st Attempt   No voicemail set up will atempt to out reach will email him providers again .  Alois Cliche -[facility] Guide , Embedded Care Coordination [facility], Care Management  [phone] [address] [place] , [place] [address] Email : [email or web address]

## 2020-11-28 ENCOUNTER — Ambulatory Visit: Payer: Medicaid Other | Admitting: Physician Assistant

## 2020-12-17 DIAGNOSIS — Z419 Encounter for procedure for purposes other than remedying health state, unspecified: Secondary | ICD-10-CM | POA: Diagnosis not present

## 2020-12-19 ENCOUNTER — Other Ambulatory Visit: Payer: Self-pay | Admitting: Obstetrics and Gynecology

## 2020-12-19 NOTE — Patient Outreach (Signed)
Care Coordination  12/19/2020  [name] 14-Jun-2001 [id number]    Medicaid Managed Care   Unsuccessful Outreach Note  12/19/2020 Name: Aaron Owens MRN: [id number] DOB: 2000-09-16  Referred by: Carlean Jews, PA-C Reason for referral : High Risk Managed Medicaid (Unsuccessful telephone outreach)   An unsuccessful telephone outreach was attempted today. The patient was referred to the case management team for assistance with care management and care coordination.   Follow Up Plan: A member of the Managed Medicaid  care management team will reach out to the patient again over the next 7 days.   [name] RN, BSN [place]  Triad Engineer, production - Managed Medicaid High Risk [phone].

## 2020-12-19 NOTE — Patient Instructions (Signed)
Hi Mr. Aaron Owens, sorry I missed you today - as a part of your Medicaid benefit, you are eligible for care management and care coordination services at no cost or copay. I was unable to reach you by phone today but would be happy to help you with your health related needs. Please feel free to call me at [phone].   A member of the Managed Medicaid care management team will reach out to you again over the next 7 days.   [name] RN, BSN [place]  Triad Engineer, production - Managed Medicaid High Risk [phone].

## 2021-01-07 ENCOUNTER — Other Ambulatory Visit: Payer: Self-pay | Admitting: Obstetrics and Gynecology

## 2021-01-07 NOTE — Patient Instructions (Signed)
Hi Aaron Owens, sorry I missed you today, I hope you are doing well - as a part of your Medicaid benefit, you are eligible for care management and care coordination services at no cost or copay. I was unable to reach you by phone today but would be happy to help you with your health related needs. Please feel free to call me at [phone].  A member of the Managed Medicaid care management team will reach out to you again over the next 7-14 days.   [name] RN, BSN [place]  Triad Engineer, production - Managed Medicaid High Risk [phone].

## 2021-01-07 NOTE — Patient Outreach (Signed)
Care Coordination  01/07/2021  Aaron Owens 2001-06-05 [id number]   Medicaid Managed Care   Unsuccessful Outreach Note  01/07/2021 Name: Aaron Owens MRN: [id number] DOB: 12/10/2000  Referred by: Carlean Jews, PA-C Reason for referral : High Risk Managed Medicaid (Unsuccessful telephone outreach)   A second unsuccessful telephone outreach was attempted today. The patient was referred to the case management team for assistance with care management and care coordination.   Follow Up Plan: A member of the Managed Medicaid care management team will reach out to the patient again over the next 7-14 days.   [name] RN, BSN [place]  Triad Engineer, production - Managed Medicaid High Risk [phone].

## 2021-01-16 ENCOUNTER — Other Ambulatory Visit: Payer: Self-pay | Admitting: Obstetrics and Gynecology

## 2021-01-16 DIAGNOSIS — Z419 Encounter for procedure for purposes other than remedying health state, unspecified: Secondary | ICD-10-CM | POA: Diagnosis not present

## 2021-01-16 NOTE — Patient Outreach (Cosign Needed)
Care Coordination  01/16/2021  Aaron Owens 09-14-2000 [id number]   Medicaid Managed Care   Unsuccessful Outreach Note  01/16/2021 Name: Aaron Owens MRN: [id number] DOB: 03-22-2001  Referred by: Carlean Jews, PA-C Reason for referral : High Risk Managed Medicaid (Unsuccessful telephone outreach)   Third unsuccessful telephone outreach was attempted today. The patient was referred to the case management team for assistance with care management and care coordination. The patient's primary care provider has been notified of our unsuccessful attempts to make or maintain contact with the patient. The care management team is pleased to engage with this patient at any time in the future should he/she be interested in assistance from the care management team.   Follow Up Plan: We have been unable to make contact with the patient for follow up. The care management team is available to follow up with the patient after provider conversation with the patient regarding recommendation for care management engagement and subsequent re-referral to the care management team.   [name] RN, BSN [place]  [facility] Care Management Coordinator - [facility] High Risk [phone].

## 2021-01-16 NOTE — Patient Instructions (Signed)
  Hi Mr. Nou, sorry I missed you today - as a part of your Medicaid benefit, you are eligible for care management and care coordination services at no cost or copay. I was unable to reach you by phone today but would be happy to help you with your health related needs. Please feel free to call me at [phone].  [name] RN, BSN [place]  [facility] [employer] - Managed Medicaid High Risk [phone].

## 2021-01-20 ENCOUNTER — Ambulatory Visit: Payer: Medicaid Other

## 2021-02-16 DIAGNOSIS — Z419 Encounter for procedure for purposes other than remedying health state, unspecified: Secondary | ICD-10-CM | POA: Diagnosis not present

## 2021-03-19 DIAGNOSIS — Z419 Encounter for procedure for purposes other than remedying health state, unspecified: Secondary | ICD-10-CM | POA: Diagnosis not present

## 2021-04-11 IMAGING — MR MR CERVICAL SPINE WO/W CM
4 of 7 series · 28 of 48 positions shown · IV contrast (gadavist)
Comparison: None.

CLINICAL DATA: Initial evaluation for 5 day history of perianal
numbness, bilateral lower extremity weakness and numbness, with
inability to ambulate.

EXAM:
MRI HEAD WITHOUT AND WITH CONTRAST
MRI CERVICAL AND THORACIC SPINE WITHOUT AND WITH CONTRAST
TECHNIQUE: Multiplanar and multiecho pulse sequences of the head, cervical
spine, to include the craniocervical junction and cervicothoracic
junction, and the thoracic spine, were obtained without and with
intravenous contrast.
CONTRAST:  6mL GADAVIST GADOBUTROL 1 MMOL/ML IV SOLN

[Series 22: T2 · sagittal · 3.0mm · 0.62mm/px · 6 of 15 slices shown (1 of 2)]
[im 1/15]
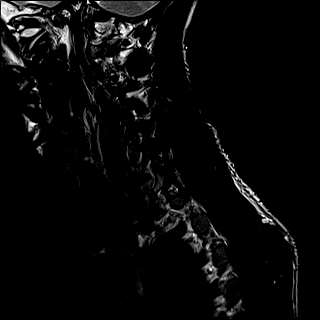
[im 3/15]
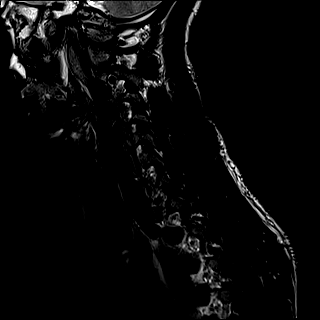
[im 6/15]
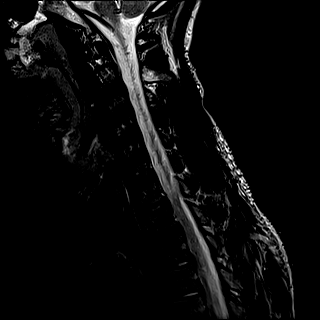
[im 9/15]
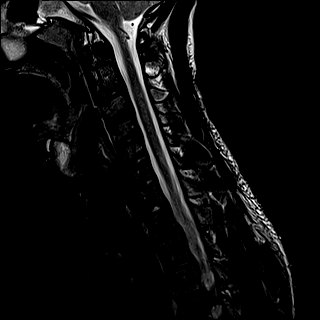
[im 12/15]
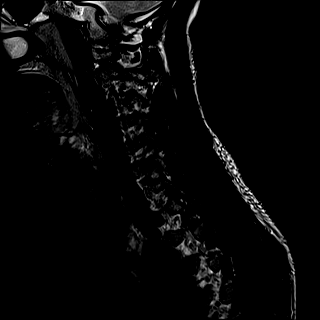
[im 15/15]
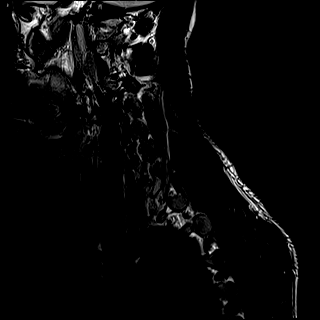

[Series 24: STIR · sagittal · 3.0mm · 0.62mm/px · 5 of 15 slices shown]
[im 1/15]
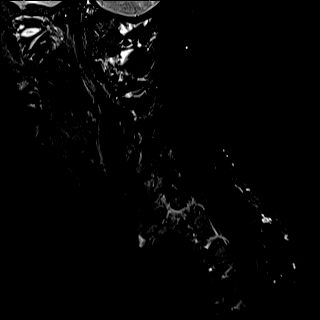
[im 4/15]
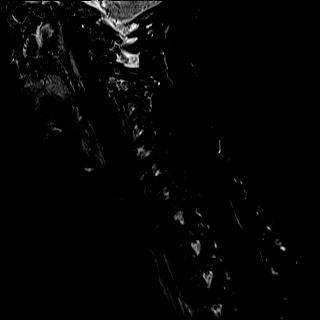
[im 8/15]
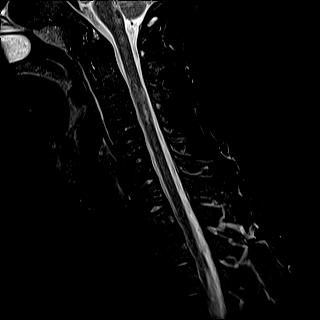
[im 11/15]
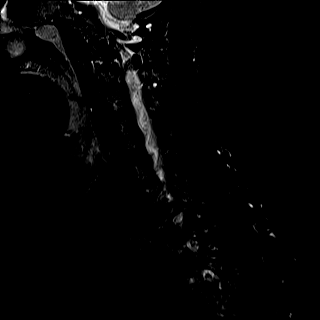
[im 15/15]
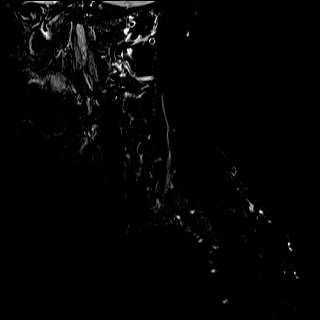

[Series 25: T2 · axial · 3.0mm · 0.70mm/px · z∈[-147,-57]mm · 9 of 29 slices shown (2 of 2)]
[im 1/29]
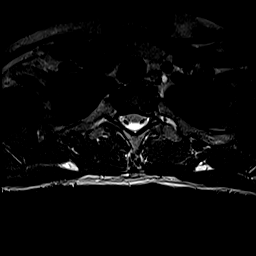
[im 4/29]
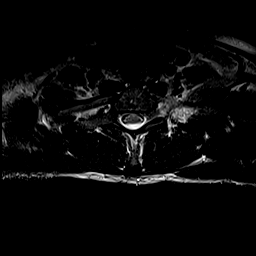
[im 8/29]
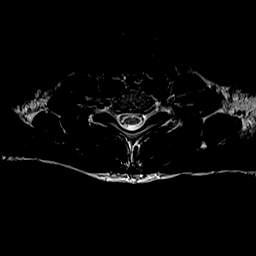
[im 11/29]
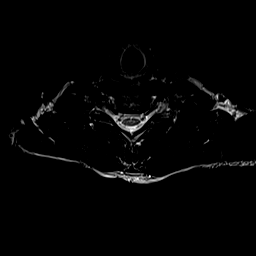
[im 15/29]
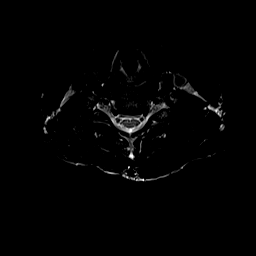
[im 18/29]
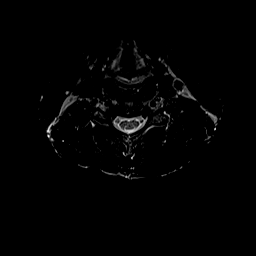
[im 22/29]
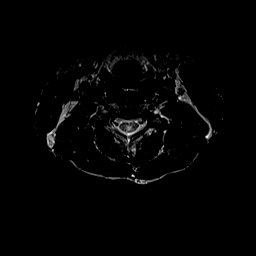
[im 25/29]
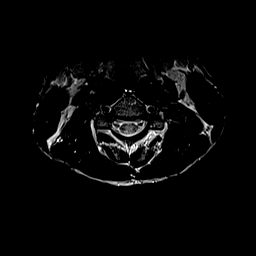
[im 29/29]
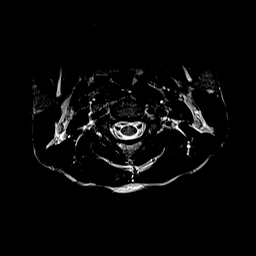

[Series 52: T1 post-contrast · axial · 3.0mm · 0.35mm/px · z∈[-144,-68]mm · 8 of 29 slices shown]
[im 1/29]
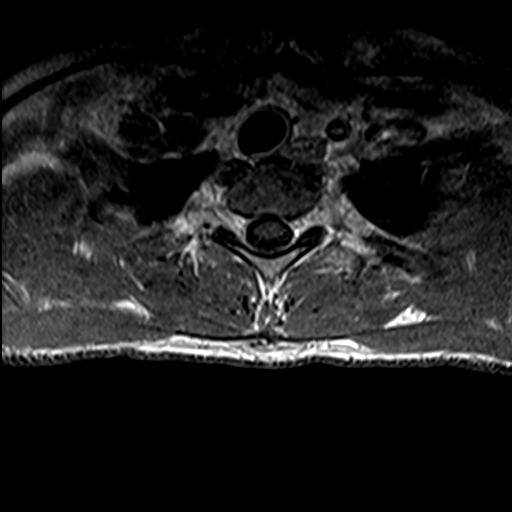
[im 4/29]
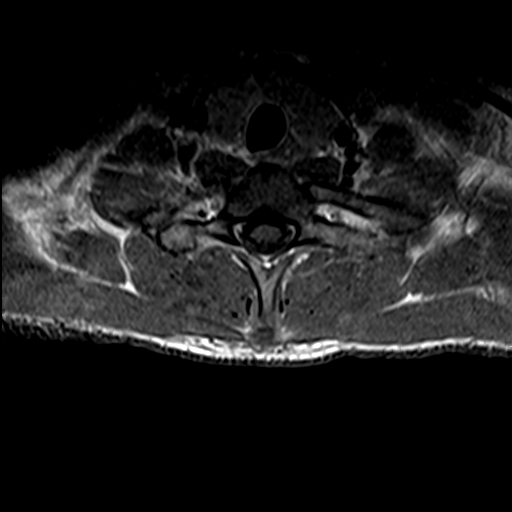
[im 8/29]
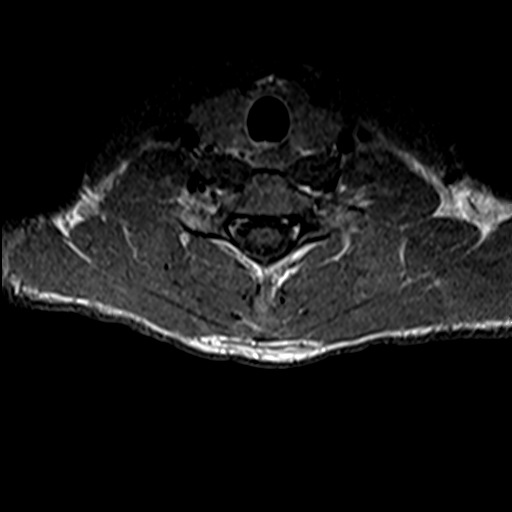
[im 11/29]
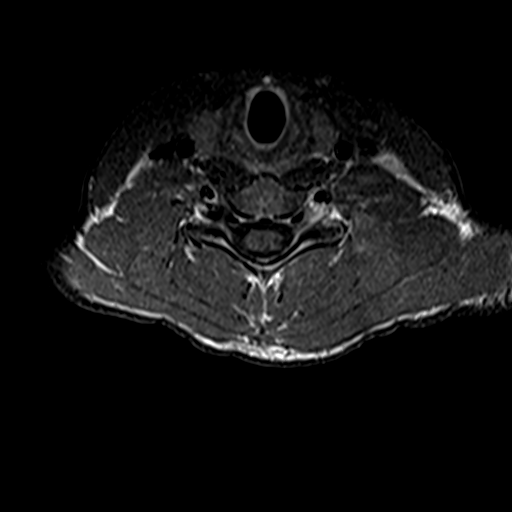
[im 15/29]
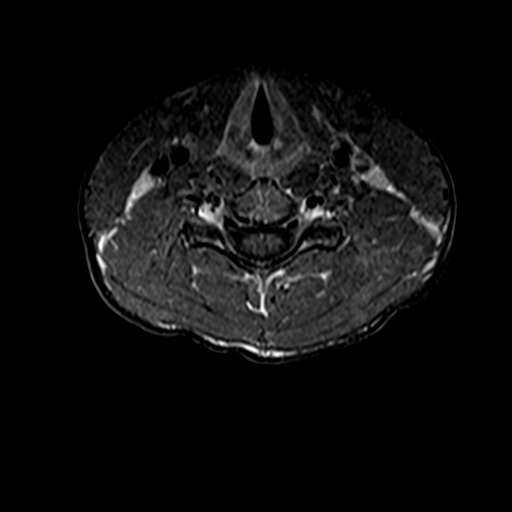
[im 18/29]
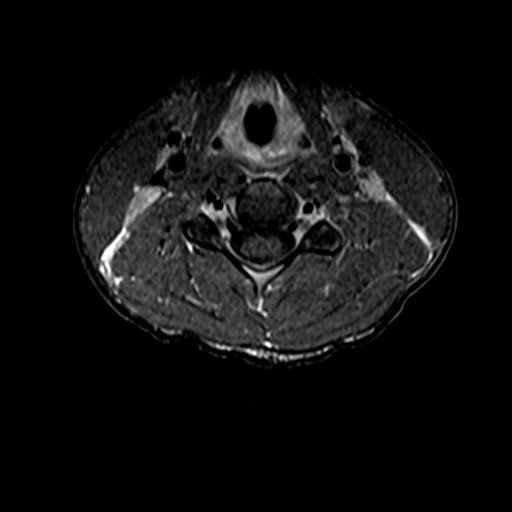
[im 22/29]
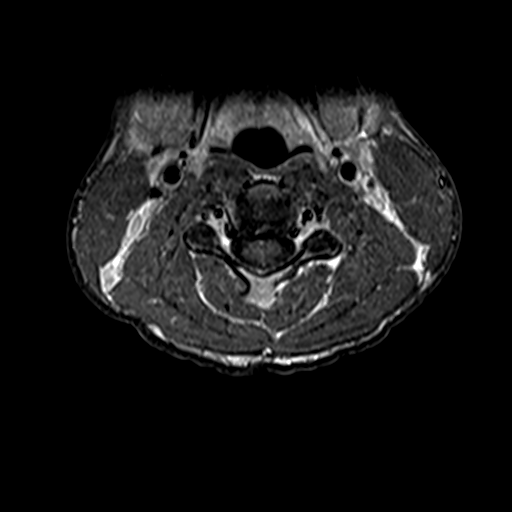
[im 25/29]
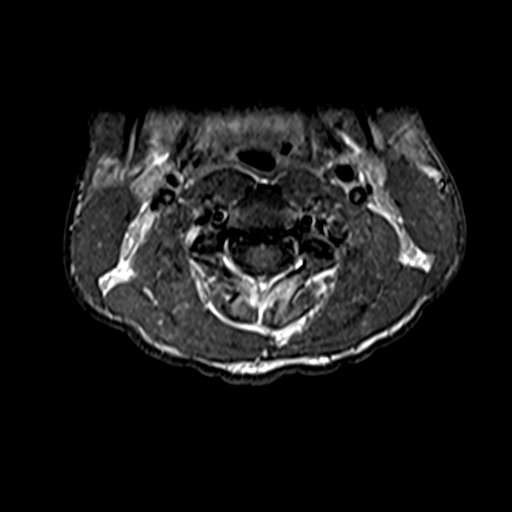

[28 of 48 positions shown; findings below may reference images not displayed]

FINDINGS: MRI HEAD FINDINGS

Brain: Cerebral volume within normal limits for age. Multifocal
patchy T2/FLAIR signal abnormality seen involving the
periventricular, deep, and juxta cortical white matter of both
cerebral hemispheres. Several of these foci are oriented
perpendicular to the lateral ventricles. Most prominent involvement
seen at the left periatrial white matter. Patchy involvement seen
about the deep gray nuclei and internal capsules. Few scattered
infratentorial foci involving the pons and brainstem noted.
Associated T1 black holes. Constellation of findings most consistent
with demyelinating disease/multiple sclerosis. Multiple scattered
associated areas of peripheral and punctate enhancement seen about
several of these lesions, consistent with active demyelination. Most
prominent active plaque seen at the left periatrial white matter and
measures 17 mm (series 49, image 81).

No evidence for acute or subacute infarct. Gray-white matter
differentiation maintained. No areas of remote cortical infarction.
No evidence for acute or chronic intracranial hemorrhage. No mass
lesion, midline shift or significant mass effect. No hydrocephalus.
No extra-axial fluid collection. Pituitary gland suprasellar region
normal. Midline structures intact.

Vascular: Major intracranial vascular flow voids are well
maintained.

Skull and upper cervical spine: Craniocervical junction within
normal limits. Heterogeneous T1 hypointense signal intensity seen
within the clivus, favored to reflect heterogeneous marrow. No other
focal marrow replacing lesion. No scalp soft tissue abnormality.

Sinuses/Orbits: Globes and orbital soft tissues within normal
limits. Paranasal sinuses and mastoid air cells are clear. Inner ear
structures grossly normal.

Other: None.

MRI CERVICAL SPINE FINDINGS

Alignment: Straightening of the normal cervical lordosis. No
listhesis.

Vertebrae: Vertebral body height maintained without evidence for
acute or chronic fracture. Bone marrow signal intensity within
normal limits. No discrete or worrisome osseous lesions. No abnormal
marrow edema or enhancement.

Cord: Focal cord signal abnormality seen involving the central and
dorsal aspect of the cord at the level of C3-4 (series 26, image 5).
Associated cord expansion and enhancement compatible with active
demyelination (series 51, image 8). No other definite signal of cord
signal abnormality seen within the cervical spinal cord.

Posterior Fossa, vertebral arteries, paraspinal tissues:
Craniocervical junction normal. Paraspinous and prevertebral soft
tissues within normal limits. Normal intravascular flow voids seen
within the vertebral arteries bilaterally.

Disc levels:

No significant disc pathology seen within the cervical spine. No
disc bulge or focal disc herniation. No significant facet
degeneration. No canal or neural foraminal stenosis.

MRI THORACIC SPINE FINDINGS

Alignment: Vertebral bodies normally aligned with preservation of
the normal thoracic kyphosis. No listhesis.

Vertebrae: Vertebral body height maintained without evidence for
acute or chronic fracture. Bone marrow signal intensity within
normal limits. No discrete or worrisome osseous lesions. No abnormal
marrow edema or enhancement.

Cord: Patchy T2/STIR signal abnormality seen within the central
aspect of the cord at the levels of T10, T11, and T12, with
involvement of the conus. Associated postcontrast enhancement
consistent with active demyelination (series 57, image 11).
Remainder of the thoracic spinal cord is within normal limits.

Paraspinal and other soft tissues: Paraspinous soft tissues
demonstrate no acute finding. Small T2 hyperintense simple cyst
noted within the left kidney. Visualized visceral structures
otherwise unremarkable.

Disc levels:

No significant disc pathology seen within the thoracic spine. No
stenosis or impingement.
IMPRESSION: MRI HEAD IMPRESSION:

1. Extensive patchy T2/FLAIR signal abnormality involving the
supratentorial and infratentorial cerebral white matter, consistent
with demyelinating disease/multiple sclerosis. Associated
post-contrast enhancement about multiple lesions consistent with
active demyelination.
2. Otherwise negative brain MRI for age.

MRI CERVICAL SPINE IMPRESSION:

1. Patchy cord signal abnormality at the level of C3-4, consistent
with demyelinating disease/multiple sclerosis. Associated
postcontrast enhancement compatible with active demyelination.
2. Otherwise normal MRI of the cervical spine.

MRI THORACIC SPINE IMPRESSION:

1. Multifocal cord signal abnormality involving the lower thoracic
spinal cord extending from T10 through the conus, compatible with
demyelinating disease/multiple sclerosis. Associated postcontrast
enhancement compatible with active demyelination.
2. Otherwise normal MRI of the thoracic spine.

## 2021-04-18 DIAGNOSIS — Z419 Encounter for procedure for purposes other than remedying health state, unspecified: Secondary | ICD-10-CM | POA: Diagnosis not present

## 2021-05-19 DIAGNOSIS — Z419 Encounter for procedure for purposes other than remedying health state, unspecified: Secondary | ICD-10-CM | POA: Diagnosis not present

## 2021-06-18 DIAGNOSIS — Z419 Encounter for procedure for purposes other than remedying health state, unspecified: Secondary | ICD-10-CM | POA: Diagnosis not present

## 2021-07-19 DIAGNOSIS — Z419 Encounter for procedure for purposes other than remedying health state, unspecified: Secondary | ICD-10-CM | POA: Diagnosis not present

## 2021-08-19 DIAGNOSIS — Z419 Encounter for procedure for purposes other than remedying health state, unspecified: Secondary | ICD-10-CM | POA: Diagnosis not present

## 2021-09-16 DIAGNOSIS — Z419 Encounter for procedure for purposes other than remedying health state, unspecified: Secondary | ICD-10-CM | POA: Diagnosis not present

## 2021-10-17 DIAGNOSIS — Z419 Encounter for procedure for purposes other than remedying health state, unspecified: Secondary | ICD-10-CM | POA: Diagnosis not present

## 2021-11-16 DIAGNOSIS — Z419 Encounter for procedure for purposes other than remedying health state, unspecified: Secondary | ICD-10-CM | POA: Diagnosis not present

## 2021-12-17 DIAGNOSIS — Z419 Encounter for procedure for purposes other than remedying health state, unspecified: Secondary | ICD-10-CM | POA: Diagnosis not present

## 2022-01-16 DIAGNOSIS — Z419 Encounter for procedure for purposes other than remedying health state, unspecified: Secondary | ICD-10-CM | POA: Diagnosis not present

## 2022-02-16 DIAGNOSIS — Z419 Encounter for procedure for purposes other than remedying health state, unspecified: Secondary | ICD-10-CM | POA: Diagnosis not present

## 2022-03-19 DIAGNOSIS — Z419 Encounter for procedure for purposes other than remedying health state, unspecified: Secondary | ICD-10-CM | POA: Diagnosis not present

## 2022-04-18 DIAGNOSIS — Z419 Encounter for procedure for purposes other than remedying health state, unspecified: Secondary | ICD-10-CM | POA: Diagnosis not present

## 2022-05-19 DIAGNOSIS — Z419 Encounter for procedure for purposes other than remedying health state, unspecified: Secondary | ICD-10-CM | POA: Diagnosis not present

## 2022-06-18 DIAGNOSIS — Z419 Encounter for procedure for purposes other than remedying health state, unspecified: Secondary | ICD-10-CM | POA: Diagnosis not present

## 2022-07-19 DIAGNOSIS — Z419 Encounter for procedure for purposes other than remedying health state, unspecified: Secondary | ICD-10-CM | POA: Diagnosis not present

## 2022-08-19 DIAGNOSIS — Z419 Encounter for procedure for purposes other than remedying health state, unspecified: Secondary | ICD-10-CM | POA: Diagnosis not present

## 2022-09-17 DIAGNOSIS — Z419 Encounter for procedure for purposes other than remedying health state, unspecified: Secondary | ICD-10-CM | POA: Diagnosis not present

## 2022-10-18 DIAGNOSIS — Z419 Encounter for procedure for purposes other than remedying health state, unspecified: Secondary | ICD-10-CM | POA: Diagnosis not present

## 2022-11-17 DIAGNOSIS — Z419 Encounter for procedure for purposes other than remedying health state, unspecified: Secondary | ICD-10-CM | POA: Diagnosis not present

## 2022-12-18 DIAGNOSIS — Z419 Encounter for procedure for purposes other than remedying health state, unspecified: Secondary | ICD-10-CM | POA: Diagnosis not present

## 2023-01-17 DIAGNOSIS — Z419 Encounter for procedure for purposes other than remedying health state, unspecified: Secondary | ICD-10-CM | POA: Diagnosis not present

## 2023-02-17 DIAGNOSIS — Z419 Encounter for procedure for purposes other than remedying health state, unspecified: Secondary | ICD-10-CM | POA: Diagnosis not present

## 2023-03-20 DIAGNOSIS — Z419 Encounter for procedure for purposes other than remedying health state, unspecified: Secondary | ICD-10-CM | POA: Diagnosis not present

## 2023-04-19 DIAGNOSIS — Z419 Encounter for procedure for purposes other than remedying health state, unspecified: Secondary | ICD-10-CM | POA: Diagnosis not present

## 2023-05-20 DIAGNOSIS — Z419 Encounter for procedure for purposes other than remedying health state, unspecified: Secondary | ICD-10-CM | POA: Diagnosis not present

## 2023-06-19 DIAGNOSIS — Z419 Encounter for procedure for purposes other than remedying health state, unspecified: Secondary | ICD-10-CM | POA: Diagnosis not present

## 2023-07-20 DIAGNOSIS — Z419 Encounter for procedure for purposes other than remedying health state, unspecified: Secondary | ICD-10-CM | POA: Diagnosis not present

## 2023-08-20 DIAGNOSIS — Z419 Encounter for procedure for purposes other than remedying health state, unspecified: Secondary | ICD-10-CM | POA: Diagnosis not present

## 2023-09-17 DIAGNOSIS — Z419 Encounter for procedure for purposes other than remedying health state, unspecified: Secondary | ICD-10-CM | POA: Diagnosis not present
